# Patient Record
Sex: Female | Born: 1955 | Hispanic: No | Marital: Married | Smoking: Current every day smoker
Health system: Southern US, Community
[De-identification: ages and names within clinical notes are randomized; demographics above are authoritative.]

## PROBLEM LIST (undated history)

## (undated) ENCOUNTER — Emergency Department (HOSPITAL_COMMUNITY): Admission: EM | Payer: Self-pay | Source: Home / Self Care

## (undated) DIAGNOSIS — I10 Essential (primary) hypertension: Secondary | ICD-10-CM

## (undated) DIAGNOSIS — K219 Gastro-esophageal reflux disease without esophagitis: Secondary | ICD-10-CM

## (undated) DIAGNOSIS — E785 Hyperlipidemia, unspecified: Secondary | ICD-10-CM

## (undated) HISTORY — PX: ABDOMINAL HYSTERECTOMY: SHX81

## (undated) HISTORY — PX: TONSILLECTOMY: SUR1361

---

## 2008-09-20 ENCOUNTER — Emergency Department (HOSPITAL_COMMUNITY): Admission: EM | Admit: 2008-09-20 | Discharge: 2008-09-20 | Payer: Self-pay | Admitting: Family Medicine

## 2009-01-08 ENCOUNTER — Ambulatory Visit: Payer: Self-pay | Admitting: Family Medicine

## 2009-01-08 DIAGNOSIS — F329 Major depressive disorder, single episode, unspecified: Secondary | ICD-10-CM | POA: Insufficient documentation

## 2009-01-08 DIAGNOSIS — Z8639 Personal history of other endocrine, nutritional and metabolic disease: Secondary | ICD-10-CM

## 2009-01-08 DIAGNOSIS — J45909 Unspecified asthma, uncomplicated: Secondary | ICD-10-CM | POA: Insufficient documentation

## 2009-01-08 DIAGNOSIS — F3289 Other specified depressive episodes: Secondary | ICD-10-CM | POA: Insufficient documentation

## 2009-01-08 DIAGNOSIS — E785 Hyperlipidemia, unspecified: Secondary | ICD-10-CM | POA: Insufficient documentation

## 2009-01-08 DIAGNOSIS — Z862 Personal history of diseases of the blood and blood-forming organs and certain disorders involving the immune mechanism: Secondary | ICD-10-CM | POA: Insufficient documentation

## 2009-01-08 DIAGNOSIS — I10 Essential (primary) hypertension: Secondary | ICD-10-CM | POA: Insufficient documentation

## 2009-01-08 DIAGNOSIS — J309 Allergic rhinitis, unspecified: Secondary | ICD-10-CM | POA: Insufficient documentation

## 2009-01-09 ENCOUNTER — Ambulatory Visit: Payer: Self-pay | Admitting: *Deleted

## 2009-01-16 ENCOUNTER — Telehealth (INDEPENDENT_AMBULATORY_CARE_PROVIDER_SITE_OTHER): Payer: Self-pay | Admitting: Family Medicine

## 2009-01-16 LAB — CONVERTED CEMR LAB
ALT: 57 units/L — ABNORMAL HIGH (ref 0–35)
AST: 33 units/L (ref 0–37)
Albumin: 4.8 g/dL (ref 3.5–5.2)
Alkaline Phosphatase: 82 units/L (ref 39–117)
Basophils Relative: 0 % (ref 0–1)
CO2: 24 meq/L (ref 19–32)
Cholesterol: 280 mg/dL — ABNORMAL HIGH (ref 0–200)
HDL: 45 mg/dL (ref >39)
Hemoglobin: 14.4 g/dL (ref 12.0–15.0)
MCV: 93.9 fL (ref 78.0–100.0)
Monocytes Relative: 5 % (ref 3–12)
Neutrophils Relative %: 56 % (ref 43–77)
RBC: 4.88 M/uL (ref 3.87–5.11)
RDW: 12.7 % (ref 11.5–15.5)
Sodium: 143 meq/L (ref 135–145)
TSH: 4.122 microintl units/mL (ref 0.350–4.50)
Total CHOL/HDL Ratio: 6.2
Triglycerides: 216 mg/dL — ABNORMAL HIGH (ref <150)
WBC: 8.2 10*3/uL (ref 4.0–10.5)

## 2009-03-12 ENCOUNTER — Ambulatory Visit: Payer: Self-pay | Admitting: Family Medicine

## 2009-03-12 ENCOUNTER — Encounter (INDEPENDENT_AMBULATORY_CARE_PROVIDER_SITE_OTHER): Payer: Self-pay | Admitting: Family Medicine

## 2009-03-12 DIAGNOSIS — R7309 Other abnormal glucose: Secondary | ICD-10-CM | POA: Insufficient documentation

## 2009-03-12 LAB — CONVERTED CEMR LAB
ALT: 32 units/L (ref 0–35)
AST: 22 units/L (ref 0–37)
Alkaline Phosphatase: 75 units/L (ref 39–117)
Bilirubin Urine: NEGATIVE
Bilirubin, Direct: 0.1 mg/dL (ref 0.0–0.3)
Hep A Total Ab: NEGATIVE
Hep B Core Total Ab: NEGATIVE
Nitrite: NEGATIVE
Specific Gravity, Urine: <1.005
Total Bilirubin: 0.4 mg/dL (ref 0.3–1.2)
Urobilinogen, UA: 0.2
VLDL: 37 mg/dL (ref 0–40)
pH: 5

## 2009-03-18 ENCOUNTER — Encounter (INDEPENDENT_AMBULATORY_CARE_PROVIDER_SITE_OTHER): Payer: Self-pay | Admitting: Family Medicine

## 2009-09-04 ENCOUNTER — Ambulatory Visit: Payer: Self-pay | Admitting: Physician Assistant

## 2009-09-04 DIAGNOSIS — M25569 Pain in unspecified knee: Secondary | ICD-10-CM | POA: Insufficient documentation

## 2009-09-05 ENCOUNTER — Telehealth: Payer: Self-pay | Admitting: Physician Assistant

## 2009-09-09 ENCOUNTER — Telehealth: Payer: Self-pay | Admitting: Physician Assistant

## 2009-09-10 LAB — CONVERTED CEMR LAB
Albumin: 4.6 g/dL (ref 3.5–5.2)
Alkaline Phosphatase: 71 units/L (ref 39–117)
CO2: 24 meq/L (ref 19–32)
Chloride: 105 meq/L (ref 96–112)
Cholesterol: 170 mg/dL (ref 0–200)
Creatinine, Ser: 0.79 mg/dL (ref 0.40–1.20)
HDL: 42 mg/dL (ref >39)
Potassium: 4.2 meq/L (ref 3.5–5.3)
Total Bilirubin: 0.4 mg/dL (ref 0.3–1.2)
Total CHOL/HDL Ratio: 4
Triglycerides: 200 mg/dL — ABNORMAL HIGH (ref <150)
VLDL: 40 mg/dL (ref 0–40)

## 2009-10-16 ENCOUNTER — Telehealth: Payer: Self-pay | Admitting: Physician Assistant

## 2009-11-04 ENCOUNTER — Ambulatory Visit: Payer: Self-pay | Admitting: Internal Medicine

## 2009-11-04 ENCOUNTER — Encounter: Payer: Self-pay | Admitting: Physician Assistant

## 2009-12-05 ENCOUNTER — Ambulatory Visit: Payer: Self-pay | Admitting: Physician Assistant

## 2009-12-05 LAB — CONVERTED CEMR LAB
Calcium: 9.3 mg/dL (ref 8.4–10.5)
Creatinine, Ser: 0.79 mg/dL (ref 0.40–1.20)
Glucose, Bld: 85 mg/dL (ref 70–99)
HDL goal, serum: 40 mg/dL
LDL Goal: 160 mg/dL
Sodium: 143 meq/L (ref 135–145)

## 2009-12-07 ENCOUNTER — Encounter: Payer: Self-pay | Admitting: Physician Assistant

## 2010-01-20 ENCOUNTER — Ambulatory Visit: Payer: Self-pay | Admitting: Physician Assistant

## 2010-01-20 DIAGNOSIS — K219 Gastro-esophageal reflux disease without esophagitis: Secondary | ICD-10-CM | POA: Insufficient documentation

## 2010-01-20 DIAGNOSIS — R1011 Right upper quadrant pain: Secondary | ICD-10-CM | POA: Insufficient documentation

## 2010-01-22 ENCOUNTER — Ambulatory Visit (HOSPITAL_COMMUNITY): Admission: RE | Admit: 2010-01-22 | Discharge: 2010-01-22 | Payer: Self-pay | Admitting: Internal Medicine

## 2010-01-26 LAB — CONVERTED CEMR LAB
AST: 18 units/L (ref 0–37)
Albumin: 4.7 g/dL (ref 3.5–5.2)
Glucose, Bld: 109 mg/dL — ABNORMAL HIGH (ref 70–99)
Potassium: 4.4 meq/L (ref 3.5–5.3)
Total Protein: 7 g/dL (ref 6.0–8.3)

## 2010-02-12 ENCOUNTER — Telehealth: Payer: Self-pay | Admitting: Physician Assistant

## 2010-03-08 ENCOUNTER — Encounter: Payer: Self-pay | Admitting: Physician Assistant

## 2010-03-08 ENCOUNTER — Emergency Department (HOSPITAL_COMMUNITY): Admission: EM | Admit: 2010-03-08 | Discharge: 2010-03-08 | Payer: Self-pay | Admitting: Family Medicine

## 2010-03-12 ENCOUNTER — Ambulatory Visit: Payer: Self-pay | Admitting: Internal Medicine

## 2010-03-12 DIAGNOSIS — T7840XA Allergy, unspecified, initial encounter: Secondary | ICD-10-CM | POA: Insufficient documentation

## 2010-03-12 DIAGNOSIS — L738 Other specified follicular disorders: Secondary | ICD-10-CM | POA: Insufficient documentation

## 2010-03-12 DIAGNOSIS — J069 Acute upper respiratory infection, unspecified: Secondary | ICD-10-CM | POA: Insufficient documentation

## 2010-03-17 ENCOUNTER — Ambulatory Visit: Payer: Self-pay | Admitting: Internal Medicine

## 2010-05-29 ENCOUNTER — Encounter: Payer: Self-pay | Admitting: Physician Assistant

## 2010-06-10 ENCOUNTER — Telehealth: Payer: Self-pay | Admitting: Physician Assistant

## 2010-06-10 ENCOUNTER — Ambulatory Visit: Payer: Self-pay | Admitting: Physician Assistant

## 2010-06-10 DIAGNOSIS — E739 Lactose intolerance, unspecified: Secondary | ICD-10-CM | POA: Insufficient documentation

## 2010-06-10 DIAGNOSIS — K7689 Other specified diseases of liver: Secondary | ICD-10-CM | POA: Insufficient documentation

## 2010-06-10 DIAGNOSIS — G43909 Migraine, unspecified, not intractable, without status migrainosus: Secondary | ICD-10-CM | POA: Insufficient documentation

## 2010-06-10 DIAGNOSIS — H409 Unspecified glaucoma: Secondary | ICD-10-CM | POA: Insufficient documentation

## 2010-06-10 LAB — CONVERTED CEMR LAB
Bilirubin Urine: NEGATIVE
Glucose, Urine, Semiquant: NEGATIVE
Hgb A1c MFr Bld: 6 %
Ketones, urine, test strip: NEGATIVE
Urobilinogen, UA: 0.2
WBC Urine, dipstick: NEGATIVE

## 2010-06-11 LAB — CONVERTED CEMR LAB
ALT: 27 units/L (ref 0–35)
Albumin: 4.8 g/dL (ref 3.5–5.2)
Alkaline Phosphatase: 91 units/L (ref 39–117)
Basophils Absolute: 0 10*3/uL (ref 0.0–0.1)
CO2: 23 meq/L (ref 19–32)
Calcium: 9.3 mg/dL (ref 8.4–10.5)
Chloride: 103 meq/L (ref 96–112)
Cholesterol: 149 mg/dL (ref 0–200)
Eosinophils Absolute: 0.1 10*3/uL (ref 0.0–0.7)
Glucose, Bld: 87 mg/dL (ref 70–99)
HCT: 46.3 % — ABNORMAL HIGH (ref 36.0–46.0)
HDL: 47 mg/dL (ref >39)
Hemoglobin: 14.8 g/dL (ref 12.0–15.0)
LDL Cholesterol: 73 mg/dL (ref 0–99)
Lymphs Abs: 3.2 10*3/uL (ref 0.7–4.0)
MCHC: 32 g/dL (ref 30.0–36.0)
Platelets: 248 10*3/uL (ref 150–400)
Potassium: 3.9 meq/L (ref 3.5–5.3)
RBC: 4.89 M/uL (ref 3.87–5.11)
Sodium: 142 meq/L (ref 135–145)
Total CHOL/HDL Ratio: 3.2
WBC: 12.4 10*3/uL — ABNORMAL HIGH (ref 4.0–10.5)

## 2010-06-12 ENCOUNTER — Ambulatory Visit (HOSPITAL_COMMUNITY): Admission: RE | Admit: 2010-06-12 | Discharge: 2010-06-12 | Payer: Self-pay | Admitting: Internal Medicine

## 2010-06-19 ENCOUNTER — Encounter: Admission: RE | Admit: 2010-06-19 | Discharge: 2010-06-19 | Payer: Self-pay | Admitting: Internal Medicine

## 2010-06-26 ENCOUNTER — Encounter: Payer: Self-pay | Admitting: Physician Assistant

## 2010-06-29 ENCOUNTER — Encounter: Payer: Self-pay | Admitting: Physician Assistant

## 2010-10-23 ENCOUNTER — Ambulatory Visit: Payer: Self-pay | Admitting: Nurse Practitioner

## 2010-12-07 ENCOUNTER — Encounter: Payer: Self-pay | Admitting: Family Medicine

## 2010-12-15 NOTE — Assessment & Plan Note (Signed)
Summary: 3 MONTHS FOR ASTHMA/HTN//KT   Vital Signs:  Patient profile:   55 year old female Height:      60 inches Weight:      177 pounds BMI:     34.69 O2 Sat:      95 % on Room air Temp:     98.3 degrees F oral Pulse rate:   76 / minute Pulse rhythm:   regular Resp:     18 per minute BP sitting:   125 / 80  (left arm) Cuff size:   regular  Vitals Entered By: Armenia Shannon (December 05, 2009 8:26 AM)  O2 Flow:  Room air CC: three month....., Hypertension Management, Lipid Management Is Patient Diabetic? No Pain Assessment Patient in pain? no       Does patient need assistance? Functional Status Self care Ambulation Normal Comments pf  1.   220    2.   220     3.   220   CC:  three month....., Hypertension Management, and Lipid Management.  History of Present Illness: 55 year old female who presents for followup.  Asthma: She seems to be having more symptoms of chest tightness and shortness of breath.  See the asthma assessment below.  It is difficult to fully assess her symptoms with the translation.  She is here with her daughter today who helps translate.  It is apparent that the patient has had fairly stable symptoms for a long time.  However, later in the interview it seems that her symptoms may have worsened.  She did have a URI recently.  She is still having some minimal cough from this.  She does have a lot of sinus drainage.  She denies fevers.  She denies hemoptysis.  She denies exertional chest pain.  She denies arm or jaw discomfort.  She denies syncope.  She is not sure how to use her inhaler.  She uses her Ventolin once or twice a week.  She feels as though she could use it several times a week.  She also notes a problem with a bitter taste with everything.  This is been ongoing for about a year.  She also has occasional epistaxis.  Dyslipidemia: Lopressor high last time.  I asked her to start artificial.  She's taking this without problem.  Depression: The  patient has not taken Xanax in several weeks.  She feels as though she should continue on it.  There were at least 2-3 days a week where she feels like she needs it for anxiety.  I question whether or not the anxiety may be playing a role in some of her increased shortness of breath and asthma-like symptoms.  Asthma History    Asthma Control Assessment:    Age range: 12+ years    Symptoms: >2 days/week    Nighttime Awakenings: 0-2/month    Interferes w/ normal activity: no limitations    SABA use (not for EIB): >2 days/week    Asthma Control Assessment: Not Well Controlled  Hypertension History:      She complains of chest pain and palpitations, but denies dyspnea with exertion and syncope.        Positive major cardiovascular risk factors include hyperlipidemia and hypertension.  Negative major cardiovascular risk factors include female age less than 72 years old, no history of diabetes, negative family history for ischemic heart disease, and non-tobacco-user status.        Further assessment for target organ damage reveals no history of ASHD, stroke/TIA,  or peripheral vascular disease.    Lipid Management History:      Positive NCEP/ATP III risk factors include hypertension.  Negative NCEP/ATP III risk factors include female age less than 68 years old, non-diabetic, no family history for ischemic heart disease, non-tobacco-user status, no ASHD (atherosclerotic heart disease), no prior stroke/TIA, no peripheral vascular disease, and no history of aortic aneurysm.        Current Medications (verified): 1)  Advair Diskus 500-50 Mcg/dose Misc (Fluticasone-Salmeterol) .Marland Kitchen.. 1 Puff 2 Times Daily 2)  Cozaar 50 Mg Tabs (Losartan Potassium) .... Take 1 Tablet By Mouth Every Morning 3)  Remeron 45 Mg Tabs (Mirtazapine) .... Take 1 Tablet By Mouth Once A Day 4)  Aldactone 25 Mg Tabs (Spironolactone) .... Take 1 Tablet By Mouth Once A Day 5)  Ventolin Hfa 108 (90 Base) Mcg/act Aers (Albuterol  Sulfate) .... Use 2 Puffs Every 4-6 Hours As Needed For Cough/shortness of Breath 6)  Alprazolam 0.5 Mg Tabs (Alprazolam) .... Take 1/2-1  Tablet By Mouth Once Daily As Needed 7)  Allegra 180 Mg Tabs (Fexofenadine Hcl) .... Take 1 Tablet By Mouth Once A Day As Needed For Allergy Symptoms 8)  Lexapro 20 Mg Tabs (Escitalopram Oxalate) .Marland Kitchen.. 1 By Mouth Daily 9)  Lipitor 20 Mg Tabs (Atorvastatin Calcium) .... Take 1 Tab By Mouth Daily **needs Office Visit/lab** 10)  Flonase 50 Mcg/act Susp (Fluticasone Propionate) .... Use 2 Sprays in Each Nostril Each Day 11)  Naprosyn 500 Mg Tabs (Naproxen) .... Take 1 Tablet By Mouth Every 12 Hours As Needed Pain. Take With Food.  Allergies (verified): No Known Drug Allergies  Past History:  Past Medical History: Allergic rhinitis Asthma - Dx in [place] as an adult; did not smoke before dx. Hypertension Depression...chronic x 20 years/has had hallucinations in past;by family description some catatonic sxs in past. 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 Just arrived in [place].Lives with adult daughter. Married Ex-smoker (6 pk years; quit 2006) Alcohol use-no Drug use-no  Physical Exam  General:  alert, well-developed, and well-nourished.   Head:  normocephalic and atraumatic.   Eyes:  pupils equal, pupils round, pupils reactive to light, and no injection.   Ears:  R ear normal and L ear normal.   Nose:  no external deformity, no nasal discharge, and mucosal erythema.   Mouth:  pharynx pink and moist, no erythema, and no exudates.   Neck:  supple and no cervical lymphadenopathy.   Lungs:  normal breath sounds, no crackles, and no wheezes.   Heart:  normal rate and regular rhythm.   Neurologic:  alert & oriented X3 and cranial nerves II-XII intact.     Impression & Recommendations:  Problem # 1:  ASTHMA (ICD-493.90) difficult to assess fully with  translation I believe she is describing poorly controlled asthma d/w Dr. Delrae Alfred will add singulair first check ECG with h/o chest discomfort, but I think she is describing chest tightness from asthma f/u in 6 weeks consider adding PPI vs. H2RA at that time  Her updated medication list for this problem includes:    Advair Diskus 500-50 Mcg/dose Misc (Fluticasone-salmeterol) .Marland Kitchen... 1 puff 2 times daily    Ventolin Hfa 108 (90 Base) Mcg/act Aers (Albuterol sulfate) ..... Use 2 puffs every 4-6 hours as needed for cough/shortness of breath    Singulair 10 Mg Tabs (Montelukast sodium) .Marland Kitchen... Take 1 tablet by mouth once a day for allergies and asthma  Problem #  2:  ALLERGIC RHINITIS (ICD-477.9) nasal membranes red and she is c/o some epistaxis as well as bitter taste have asked her to take a "holiday" from flonase she can use 3 weeks and go off x 2 weeks she should also use saline nose spray as needed throughout the day  Her updated medication list for this problem includes:    Allegra 180 Mg Tabs (Fexofenadine hcl) .Marland Kitchen... Take 1 tablet by mouth once a day as needed for allergy symptoms    Flonase 50 Mcg/act Susp (Fluticasone propionate) ..... Use 2 sprays in each nostril each day  Problem # 3:  HYPERLIPIDEMIA (ICD-272.4) added fish oil last visit  Her updated medication list for this problem includes:    Lipitor 20 Mg Tabs (Atorvastatin calcium) .Marland Kitchen... Take 1 tab by mouth daily **needs office visit/lab**  Problem # 4:  HYPERTENSION (ICD-401.9)  controlled  Her updated medication list for this problem includes:    Cozaar 50 Mg Tabs (Losartan potassium) .Marland Kitchen... Take 1 tablet by mouth every morning    Aldactone 25 Mg Tabs (Spironolactone) .Marland Kitchen... Take 1 tablet by mouth once a day  Orders: T-Basic Metabolic Panel [id number]) EKG w/ Interpretation (93000)  Problem # 5:  DEPRESSION (ICD-311) anxiety may be playing a role in her symptoms as well will refill limited amount of  alprazolam have advised her to use only as needed  Her updated medication list for this problem includes:    Remeron 45 Mg Tabs (Mirtazapine) .Marland Kitchen... Take 1 tablet by mouth once a day    Alprazolam 0.5 Mg Tabs (Alprazolam) .Marland Kitchen... Take 1/2-1  tablet by mouth once daily as needed    Lexapro 20 Mg Tabs (Escitalopram oxalate) .Marland Kitchen... 1 by mouth daily  Complete Medication List: 1)  Advair Diskus 500-50 Mcg/dose Misc (Fluticasone-salmeterol) .Marland Kitchen.. 1 puff 2 times daily 2)  Cozaar 50 Mg Tabs (Losartan potassium) .... Take 1 tablet by mouth every morning 3)  Remeron 45 Mg Tabs (Mirtazapine) .... Take 1 tablet by mouth once a day 4)  Aldactone 25 Mg Tabs (Spironolactone) .... Take 1 tablet by mouth once a day 5)  Ventolin Hfa 108 (90 Base) Mcg/act Aers (Albuterol sulfate) .... Use 2 puffs every 4-6 hours as needed for cough/shortness of breath 6)  Alprazolam 0.5 Mg Tabs (Alprazolam) .... Take 1/2-1  tablet by mouth once daily as needed 7)  Allegra 180 Mg Tabs (Fexofenadine hcl) .... Take 1 tablet by mouth once a day as needed for allergy symptoms 8)  Lexapro 20 Mg Tabs (Escitalopram oxalate) .Marland Kitchen.. 1 by mouth daily 9)  Lipitor 20 Mg Tabs (Atorvastatin calcium) .... Take 1 tab by mouth daily **needs office visit/lab** 10)  Flonase 50 Mcg/act Susp (Fluticasone propionate) .... Use 2 sprays in each nostril each day 11)  Naprosyn 500 Mg Tabs (Naproxen) .... Take 1 tablet by mouth every 12 hours as needed pain. take with food. 12)  Singulair 10 Mg Tabs (Montelukast sodium) .... Take 1 tablet by mouth once a day for allergies and asthma  Hypertension Assessment/Plan:      The patient's hypertensive risk group is category B: At least one risk factor (excluding diabetes) with no target organ damage.  Her calculated 10 year risk of coronary heart disease is 7 %.  Today's blood pressure is 125/80.  Her blood pressure goal is < 140/90.  Lipid Assessment/Plan:      Based on NCEP/ATP III, the patient's risk factor  category is "0-1 risk factors".  The patient's lipid goals are as  follows: Total cholesterol goal is 200; LDL cholesterol goal is 160; HDL cholesterol goal is 40; Triglyceride goal is 150.    Patient Instructions: 1)  I have sent a prescription to [facility] for Singulair.  Take one tablet every day for allergies and asthma. 2)  Use your ventolin inhaler 1-2 puffs every 4-6 hours as needed for coughing, wheezing, shortness of breath. 3)  Take 2 weeks off from flonase.  You can use for 3 weeks, then go off for 2 weeks, then back on for 3 weeks, and so on. 4)  Use saline nose spray (get it over the counter) several times throughout the day as needed (but not at the same time as flonase). 5)  Schedule a follow up with Josilynn Losh in 6 weeks for asthma. Prescriptions: SINGULAIR 10 MG TABS (MONTELUKAST SODIUM) Take 1 tablet by mouth once a day for allergies and asthma  #30 x 5   Entered and Authorized by:   Tereso Newcomer PA-C   Signed by:   Tereso Newcomer PA-C on 12/05/2009   Method used:   Faxed to ...       [facility] (retail)       [address] [place], [place]  [address]       Ph: [phone]       Fax: [phone]   RxID:   [id number] ALPRAZOLAM 0.5 MG TABS (ALPRAZOLAM) Take 1/2-1  tablet by mouth once daily as needed  #30 x 0   Entered and Authorized by:   Tereso Newcomer PA-C   Signed by:   Tereso Newcomer PA-C on 12/05/2009   Method used:   Print then Give to Patient   RxID:   [id number]    EKG  Procedure date:  12/05/2009  Findings:      NSR HR 64 Normal axis no acute changes

## 2010-12-15 NOTE — Letter (Signed)
Summary: DENTAL REFERRAL  DENTAL REFERRAL   Imported By: Arta Bruce 05/29/2010 09:10:22  _____________________________________________________________________  External Attachment:    Type:   Image     Comment:   External Document

## 2010-12-15 NOTE — Letter (Signed)
Summary: TRIAGE CALL  TRIAGE CALL   Imported By: Arta Bruce 03/16/2010 10:37:21  _____________________________________________________________________  External Attachment:    Type:   Image     Comment:   External Document

## 2010-12-15 NOTE — Assessment & Plan Note (Signed)
Summary: 6 week fu on asthma///kt   Vital Signs:  Patient profile:   55 year old female Height:      60 inches Weight:      177 pounds BMI:     34.69 Temp:     98.1 degrees F oral Pulse rate:   80 / minute Pulse rhythm:   regular Resp:     18 per minute BP sitting:   110 / 80  (left arm) Cuff size:   regular  Vitals Entered By: [place] Shannon (January 20, 2010 8:35 AM) CC: six week f/u... pt says she has starting to feel right ear pressure sometimes.... pt says she feels pressure on her right side of her stomach..., Lipid Management Is Patient Diabetic? No Pain Assessment Patient in pain? no       Does patient need assistance? Functional Status Self care Ambulation Normal   CC:  six week f/u... pt says she has starting to feel right ear pressure sometimes.... pt says she feels pressure on her right side of her stomach... and Lipid Management.  History of Present Illness: Here for f/u.  Asthma:  Added singulair last time.  Symptoms seem much better.  Says she is only using ventolin maybe every other week.  No chest tightness.    Allergies:  She is using saline for 1-2 weeks and then flonase for one week.  She denies having as much nasal dryness.  She denies any further epistaxis.  GERD:  Using TUMS every day.  + indigestion.  No excessive caffeine.  NO smoking.  No dysphagia.  No melena or hematochezia.  No belching.  Ear:  Notes pressure in right ear.  Not internal.    RUQ pressure:  Feels pressure 2-3 times a week in RUQ.  She has noted this symptom for years.  Did not have for 2 years.  Started having it recently.  Eating does not bring it on.  No nausea.  No vomiting.  No weight loss.  No night sweats.  No diarrhea.  Asthma History    Asthma Control Assessment:    Age range: 12+ years    Symptoms: 0-2 days/week    Nighttime Awakenings: 0-2/month    Interferes w/ normal activity: no limitations    SABA use (not for EIB): 0-2 days/week    Asthma Control Assessment:  Well Controlled  Lipid Management History:      Positive NCEP/ATP III risk factors include hypertension.  Negative NCEP/ATP III risk factors include female age less than 28 years old, non-diabetic, no family history for ischemic heart disease, non-tobacco-user status, no ASHD (atherosclerotic heart disease), no prior stroke/TIA, no peripheral vascular disease, and no history of aortic aneurysm.     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 1 Puff 2 Times Daily 2)  Cozaar 50 Mg Tabs (Losartan Potassium) .... Take 1 Tablet By Mouth Every Morning 3)  Remeron  45 Mg Tabs (Mirtazapine) .... Take 1 Tablet By Mouth Once A Day 4)  Aldactone 25 Mg Tabs (Spironolactone) .... Take 1 Tablet By Mouth Once A Day 5)  Ventolin Hfa 108 (90 Base) Mcg/act Aers (Albuterol Sulfate) .... Use 2 Puffs Every 4-6 Hours As Needed For Cough/shortness of Breath 6)  Alprazolam 0.5 Mg Tabs (Alprazolam) .... Take 1/2-1  Tablet By Mouth Once Daily As Needed 7)  Allegra 180 Mg Tabs (Fexofenadine Hcl) .... Take 1 Tablet By Mouth Once A Day As Needed For Allergy Symptoms 8)  Lexapro 20 Mg Tabs (Escitalopram Oxalate) .Marland Kitchen.. 1 By Mouth Daily 9)  Lipitor 20 Mg Tabs (Atorvastatin Calcium) .... Take 1 Tab By Mouth Daily **needs Office Visit/lab** 10)  Flonase 50 Mcg/act Susp (Fluticasone Propionate) .... Use 2 Sprays in Each  Nostril Each Day 11)  Naprosyn 500 Mg Tabs (Naproxen) .... Take 1 Tablet By Mouth Every 12 Hours As Needed Pain. Take With Food. 12)  Singulair 10 Mg Tabs (Montelukast Sodium) .... Take 1 Tablet By Mouth Once A Day For Allergies and Asthma  Allergies (verified): No Known Drug Allergies  Past History:  Past Medical History: Last updated: 12/05/2009 Allergic rhinitis Asthma - Dx in [place] as an adult; did not smoke before dx. Hypertension Depression...chronic x 20 years/has had hallucinations in past;by family description some catatonic sxs in past. h/o endometriosis 11/1988 Hyperlipidemia  Past Surgical History: Last updated: 03/12/2009 s/p Ovarian surgery 11/15/1988. Endometriosis 11/1988  Physical Exam  General:  alert, well-developed, and well-nourished.   Head:  normocephalic and atraumatic.   Eyes:  pupils equal, pupils round, and pupils reactive to light.   Ears:  R ear normal and L ear normal.   Nose:  no external deformity.   Mouth:  pharynx pink and moist.   Lungs:  normal breath sounds, no crackles, and no wheezes.   Heart:  normal rate and regular rhythm.   Abdomen:  soft, non-tender, normal bowel sounds, no masses, and no hepatomegaly.   Neurologic:  alert & oriented X3 and cranial nerves II-XII intact.   Skin:  turgor normal.   Psych:  normally interactive.     Impression & Recommendations:  Problem # 1:  GERD (ICD-530.81)  add Pepcid take x 6 weeks, then as needed may help asthma symptoms further  Her updated medication list for this problem includes:    Pepcid 20 Mg Tabs (Famotidine) .Marland Kitchen... Take 1 tablet by mouth two times a day for 6 weeks, then two times a day as needed for indigestion  Problem # 2:  ASTHMA (ICD-493.90) much better controlled  cont current meds  Her updated medication list for this problem includes:    Advair Diskus 500-50 Mcg/dose Misc (Fluticasone-salmeterol) .Marland Kitchen... 1 puff 2 times daily    Ventolin Hfa 108 (90 Base) Mcg/act Aers  (Albuterol sulfate) ..... Use 2 puffs every 4-6 hours as needed for cough/shortness of breath    Singulair 10 Mg Tabs (Montelukast sodium) .Marland Kitchen... Take 1 tablet by mouth once a day for allergies and asthma  Problem # 3:  ALLERGIC RHINITIS (ICD-477.9) epistaxis better with alternating flonase and saline exam on right ear benign reassurance and monitor  Her updated medication list for this problem includes:    Allegra 180 Mg Tabs (Fexofenadine hcl) .Marland Kitchen... Take 1 tablet by mouth once a day as needed for allergy symptoms    Flonase 50 Mcg/act Susp (Fluticasone propionate) ..... Use 2 sprays in each nostril each day  Problem # 4:  RUQ PAIN (  ICD-789.01)  describes having abd u/s in [place] years ago with a "knot" noted on her liver surgery; biopsy never recommended no h/o cholecystectomy has had mild elevation in transaminases in past hepatitis serologies negative in 2010 check cmet check abd ultrasound  Orders: T-Comprehensive Metabolic Panel ([id number]) Ultrasound (Ultrasound)  Problem # 5:  HYPERTENSION (ICD-401.9) controlled  Her updated medication list for this problem includes:    Cozaar 50 Mg Tabs (Losartan potassium) .Marland Kitchen... Take 1 tablet by mouth every morning    Aldactone 25 Mg Tabs (Spironolactone) .Marland Kitchen... Take 1 tablet by mouth once a day  Problem # 6:  Preventive Health Care (ICD-V70.0) schedule CPP  Problem # 7:  HYPERLIPIDEMIA (ICD-272.4) check lipids at next visit  Her updated medication list for this problem includes:    Lipitor 20 Mg Tabs (Atorvastatin calcium) .Marland Kitchen... Take 1 tab by mouth daily **needs office visit/lab**  Complete Medication List: 1)  Advair Diskus 500-50 Mcg/dose Misc (Fluticasone-salmeterol) .Marland Kitchen.. 1 puff 2 times daily 2)  Cozaar 50 Mg Tabs (Losartan potassium) .... Take 1 tablet by mouth every morning 3)  Remeron 45 Mg Tabs (Mirtazapine) .... Take 1 tablet by mouth once a day 4)  Aldactone 25 Mg Tabs (Spironolactone) .... Take 1 tablet by mouth  once a day 5)  Ventolin Hfa 108 (90 Base) Mcg/act Aers (Albuterol sulfate) .... Use 2 puffs every 4-6 hours as needed for cough/shortness of breath 6)  Alprazolam 0.5 Mg Tabs (Alprazolam) .... Take 1/2-1  tablet by mouth once daily as needed 7)  Allegra 180 Mg Tabs (Fexofenadine hcl) .... Take 1 tablet by mouth once a day as needed for allergy symptoms 8)  Lexapro 20 Mg Tabs (Escitalopram oxalate) .Marland Kitchen.. 1 by mouth daily 9)  Lipitor 20 Mg Tabs (Atorvastatin calcium) .... Take 1 tab by mouth daily **needs office visit/lab** 10)  Flonase 50 Mcg/act Susp (Fluticasone propionate) .... Use 2 sprays in each nostril each day 11)  Naprosyn 500 Mg Tabs (Naproxen) .... Take 1 tablet by mouth every 12 hours as needed pain. take with food. 12)  Singulair 10 Mg Tabs (Montelukast sodium) .... Take 1 tablet by mouth once a day for allergies and asthma 13)  Pepcid 20 Mg Tabs (Famotidine) .... Take 1 tablet by mouth two times a day for 6 weeks, then two times a day as needed for indigestion  Lipid Assessment/Plan:      Based on NCEP/ATP III, the patient's risk factor category is "0-1 risk factors".  The patient's lipid goals are as follows: Total cholesterol goal is 200; LDL cholesterol goal is 160; HDL cholesterol goal is 40; Triglyceride goal is 150.     Patient Instructions: 1)  Please schedule a follow-up appointment in 2 months with Aurea Aronov for CPP.  Come fasting . . . do not eat or drink anything after midnight the night before (except water). 2)  Prescription for pepcid was sent to the [facility] on [facility]. 3)    Prescriptions: PEPCID 20 MG TABS (FAMOTIDINE) Take 1 tablet by mouth two times a day for 6 weeks, then two times a day as needed for indigestion  #60 x 3   Entered and Authorized by:   Tereso Newcomer PA-C   Signed by:   Tereso Newcomer PA-C on 01/20/2010   Method used:   Faxed to ...       [facility] (retail)       [address]  [place], [place]  [address]       Ph: [phone]       Fax: [phone]   RxID:   [id number]

## 2010-12-15 NOTE — Assessment & Plan Note (Signed)
Summary: 2236 PT/REACTION TO MEDS//KT   Vital Signs:  Patient profile:   55 year old female Weight:      179 pounds Temp:     97.9 degrees F Pulse rate:   76 / minute Pulse rhythm:   regular Resp:     14 per minute BP sitting:   120 / 77  (left arm) Cuff size:   regular  Vitals Entered By: Chauncy Passy, SMA CC: Pt. is here for a f/u from the hosp. Pt. went to the hosp. for a break out of rosacea on face, neck and arms. This began last Thursday and went to the [facility]. on Sunday. Pt. is also complaiining of abd. pain and itching on the face.  Is Patient Diabetic? No Pain Assessment Patient in pain? no       Does patient need assistance? Functional Status Self care Ambulation Normal   CC:  Pt. is here for a f/u from the hosp. Pt. went to the hosp. for a break out of rosacea on face and neck and arms. This began last Thursday and went to the [facility]. on Sunday. Pt. is also complaiining of abd. pain and itching on the face. Marland Kitchen  History of Present Illness: 1.  Rash started on face, then ears one week ago.  Eventually on wrists.  Pt. with hx of Rosacea, but has not had outbreak in 2-3 years.  Pt. states through translation, (originally from [place]) rash started out as a few purple dots.  Very pruritic--started day after initiation of meds from ED.  No pustular lesions per patient, but described as papular pustular lesions by ED..  Pt. not clear as to how much this resembles the lesions she had with Rosacea.  Was seen in ED 03/08/10 and started on Doxycycline and topical Metronidazole.-on antibiotics for 4 days.    Pt. with no new exposures--pt. apparently very careful regarding this.  Last use of both oral and topical antibiotics was last evening.  Also with congestion and cough in ED and started on cough medication for that (Codeine and Tylenol elixir)  Lungs were clear in ED.  Last use of cough medication was last night.  Takes Allegra in the morning.  Current Medications (verified): 1)   Advair Diskus 500-50 Mcg/dose Misc (Fluticasone-Salmeterol) .Marland Kitchen.. 1 Puff 2 Times Daily 2)  Cozaar 50 Mg Tabs (Losartan Potassium) .... Take 1 Tablet By Mouth Every Morning 3)  Remeron 45 Mg Tabs (Mirtazapine) .... Take 1 Tablet By Mouth Once A Day 4)  Aldactone 25 Mg Tabs (Spironolactone) .... Take 1 Tablet By Mouth Once A Day 5)  Ventolin Hfa 108 (90 Base) Mcg/act Aers (Albuterol Sulfate) .... Use 2 Puffs Every 4-6 Hours As Needed For Cough/shortness of Breath 6)  Alprazolam 0.5 Mg Tabs (Alprazolam) .... Take 1/2-1  Tablet By Mouth Once Daily As Needed 7)  Allegra 180 Mg Tabs (Fexofenadine Hcl) .... Take 1 Tablet By Mouth Once A Day As Needed For Allergy Symptoms 8)  Lexapro 20 Mg Tabs (Escitalopram Oxalate) .Marland Kitchen.. 1 By Mouth Daily 9)  Lipitor 20 Mg Tabs (Atorvastatin Calcium) .... Take 1 Tab By Mouth Daily **needs Office Visit/lab** 10)  Flonase 50 Mcg/act Susp (Fluticasone Propionate) .... Use 2 Sprays in Each Nostril Each Day 11)  Naprosyn 500 Mg Tabs (Naproxen) .... Take 1 Tablet By Mouth Every 12 Hours As Needed Pain. Take With Food. 12)  Singulair 10 Mg Tabs (Montelukast Sodium) .... Take 1 Tablet By Mouth Once A Day For Allergies and  Asthma 13)  Pepcid 20 Mg Tabs (Famotidine) .... Take 1 Tablet By Mouth Two Times A Day For 6 Weeks, Then Two Times A Day As Needed For Indigestion  Allergies (verified): 1)  ! Metrocream (Metronidazole)  Physical Exam  General:  NAD--obvious rash on face and right wrist. Eyes:  MIld edema of upper and lower lids with slight injection of conjunctivae. Ears:  External ear exam shows no significant lesions or deformities.  Otoscopic examination reveals clear canals, tympanic membranes are intact bilaterally without bulging, retraction, inflammation or discharge. Hearing is grossly normal bilaterally. Nose:  External nasal examination shows no deformity or inflammation. Nasal mucosa are pink and moist without lesions or exudates. Mouth:  Oral mucosa and  oropharynx without lesions or exudates.  Teeth in good repair. Neck:  No deformities, masses, or tenderness noted. Lungs:  Slight rhonchi initially--resolves with cough.  Good air movement and no wheezing. Heart:  Normal rate and regular rhythm. S1 and S2 normal without gallop, murmur, click, rub or other extra sounds. Skin:  1-2 mm pustules scattered on chest--pt. states these just started--has not applied Metronidazole to this area.   Face with edemoutous, raised, erythematous small macules with  intermingles pustules on forehead, T zone area, cheeks, preauricular areas, posterior neck and volar wrist--very confluent with these lesions--appear much more erythematous and edematous than newer, topically untreated areas of chest.    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 Continue with Doxycycline--appears to be just affected in areas where Metronidazole applied--to stop the latter.  Problem # 2:  ALLERGIC REACTION, ACUTE (ICD-995.3) Stop Metronidazole Start short course of Prednisone. Continue a.m. Allegra Add P.m. Benadryl  Problem # 3:  UPPER RESPIRATORY INFECTION (ICD-465.9) Vs. Allergies--supportive care Prednisone may help. Suggested holding cough suppressant/codeine as needs to mobilize secretions. Her updated medication list for this problem includes:    Allegra 180 Mg Tabs (Fexofenadine hcl) .Marland Kitchen... Take 1 tablet by mouth once a day as needed for allergy symptoms    Naprosyn 500 Mg Tabs (Naproxen) .Marland Kitchen... Take 1 tablet by mouth every 12 hours as needed pain. take with food.  Complete Medication List: 1)  Advair Diskus 500-50 Mcg/dose Misc (Fluticasone-salmeterol) .Marland Kitchen.. 1 puff 2 times daily 2)  Cozaar 50 Mg Tabs (Losartan potassium) .... Take 1 tablet by mouth every morning 3)  Remeron 45 Mg Tabs (Mirtazapine) .... Take 1 tablet by mouth once a day 4)  Aldactone 25 Mg Tabs (Spironolactone) .... Take 1 tablet by mouth once a day 5)   Ventolin Hfa 108 (90 Base) Mcg/act Aers (Albuterol sulfate) .... Use 2 puffs every 4-6 hours as needed for cough/shortness of breath 6)  Alprazolam 0.5 Mg Tabs (Alprazolam) .... Take 1/2-1  tablet by mouth once daily as needed 7)  Allegra 180 Mg Tabs (Fexofenadine hcl) .... Take 1 tablet by mouth once a day as needed for allergy symptoms 8)  Lexapro 20 Mg Tabs (Escitalopram oxalate) .Marland Kitchen.. 1 by mouth daily 9)  Lipitor 20 Mg Tabs (Atorvastatin calcium) .... Take 1 tab by mouth daily **needs office visit/lab** 10)  Flonase 50 Mcg/act Susp (Fluticasone propionate) .... Use 2 sprays in each nostril each day 11)  Naprosyn 500 Mg Tabs (Naproxen) .... Take 1 tablet by mouth every 12 hours as needed pain. take with food. 12)  Singulair 10 Mg Tabs (Montelukast sodium) .... Take 1 tablet by mouth once a day for allergies and asthma 13)  Pepcid 20 Mg Tabs (Famotidine) .... Take 1 tablet  by mouth two times a day for 6 weeks, then two times a day as needed for indigestion 14)  Prednisone 20 Mg Tabs (Prednisone) .Marland Kitchen.. 1 tab by mouth daily for 5 days  Patient Instructions: 1)  Stop Metronidazole cream 2)  Continue Doxycycline--unless rash and itching continue to get worse. 3)  Would get Robitussin for cough instead of cough syrup from ED 4)  Cool compresses for itching and rash. 5)  Start prednisone today 6)  Benadryl (Diphenhydramine )  25 mg at bedtime for itching and swelling. 7)  Follow up Monday with Tereso Newcomer or with Dr. Delrae Alfred on Tuesday. Prescriptions: PREDNISONE 20 MG TABS (PREDNISONE) 1 tab by mouth daily for 5 days  #5 x 0   Entered and Authorized by:   Julieanne Manson MD   Signed by:   Julieanne Manson MD on 03/12/2010   Method used:   Electronically to        [facility] [address] (retail)       [phone]       [place], [place]  [address]       Ph: [phone]       Fax: [phone]   RxID:   [id number]

## 2010-12-15 NOTE — Assessment & Plan Note (Signed)
Summary: FU FROM LAST VISIT//KT   Vital Signs:  Patient profile:   55 year old female Height:      60 inches Weight:      181 pounds BMI:     35.48 Temp:     98.3 degrees F oral Pulse rate:   73 / minute Pulse rhythm:   regular Resp:     18 per minute BP sitting:   145 / 77  (left arm) Cuff size:   regular  Vitals Entered By: [place] Shannon (June 10, 2010 11:44 AM) CC: f/u from last appt...Marland KitchenMarland Kitchen pt says she is doing a lot better...., Hypertension Management, Abdominal Pain Is Patient Diabetic? Yes Pain Assessment Patient in pain? no      CBG Result 108  Does patient need assistance? Functional Status Self care Ambulation Normal Comments pf   1.   220      2.    230          3.   230   Primary Care Provider:  Tereso Newcomer PA-C  CC:  f/u from last appt...Marland KitchenMarland Kitchen pt says she is doing a lot better...., Hypertension Management, and Abdominal Pain.  History of Present Illness: 55 year old female returns for followup.  Since I last saw her, she saw Dr. Delrae Alfred for an allergic reaction on her face.  It sounds like this was secondary to metronidazole gel for rosacea.  This completely cleared.  She continues on Advair twice a day and as needed Ventolin.  Her asthma symptoms are stable.  She admits to compliance with her blood pressure medications.  She has seen ophthalmology.  She does have glaucoma.  She is now on Travatan eyedrops.  She is hesitant to go back to see the ophthalmologist secondary to cost.  She has started taking Pepcid.  She only takes it once a day now.  Her symptoms have completely resolved with the Pepcid.  She had an elevated sugar on recent blood work.  Her A1c today 6.  She does have fatty infiltration of her liver and abdominal ultrasound.  She's currently being treated for hyperlipidemia.  She was due for a CPP.  She has not yet scheduled that.  She notes occasional headaches since she was 16.  This is  right-sided.  It is throbbing.  She denies scotoma.  She denies  associated nausea or photophobia.  Asthma History    Asthma Control Assessment:    Age range: 12+ years    Symptoms: 0-2 days/week    Nighttime Awakenings: 0-2/month    Interferes w/ normal activity: no limitations    SABA use (not for EIB): 0-2 days/week    Asthma Control Assessment: Well Controlled  Dyspepsia History:      She has no alarm features of dyspepsia including no history of melena, hematochezia, dysphagia, persistent vomiting, or involuntary weight loss > 5%.  There is a prior history of GERD.  An H-2 blocker medication is currently being taken.    Hypertension History:      She complains of headache, but denies chest pain, dyspnea with exertion, and syncope.  Further comments include: rare headaches; has a h/o migraines .        Positive major cardiovascular risk factors include hyperlipidemia and hypertension.  Negative major cardiovascular risk factors include female age less than 58 years old, no history of diabetes, negative family history for ischemic heart disease, and non-tobacco-user status.        Further assessment for target organ damage reveals no  history of ASHD, stroke/TIA, or peripheral vascular disease.     Current Medications (verified): 1)  Advair Diskus 500-50 Mcg/dose Misc (Fluticasone-Salmeterol) .Marland Kitchen.. 1 Puff 2 Times Daily 2)  Cozaar 50 Mg Tabs (Losartan Potassium) .... Take 1 Tablet By Mouth Every Morning 3)  Remeron 45 Mg Tabs (Mirtazapine) .... Take 1 Tablet By Mouth Once A Day 4)  Aldactone 25 Mg Tabs (Spironolactone) .... Take 1 Tablet By Mouth Once A Day 5)  Ventolin Hfa 108 (90 Base) Mcg/act Aers (Albuterol Sulfate) .... Use 2 Puffs Every 4-6 Hours As Needed For Cough/shortness of Breath 6)  Alprazolam 0.5 Mg Tabs (Alprazolam) .... Take 1/2-1  Tablet By Mouth Once Daily As Needed 7)  Allegra 180 Mg Tabs (Fexofenadine Hcl) .... Take 1 Tablet By Mouth Once A Day As Needed For Allergy Symptoms 8)  Lexapro 20 Mg Tabs (Escitalopram Oxalate) .Marland Kitchen.. 1 By  Mouth Daily 9)  Lipitor 20 Mg Tabs (Atorvastatin Calcium) .... Take 1 Tab By Mouth Daily **needs Office Visit/lab** 10)  Fluticasone Propionate 50 Mcg/act Susp (Fluticasone Propionate) .... 2 Sprays Each Nostril Daily 11)  Naprosyn 500 Mg Tabs (Naproxen) .... Take 1 Tablet By Mouth Every 12 Hours As Needed Pain. Take With Food. 12)  Singulair 10 Mg Tabs (Montelukast Sodium) .... Take 1 Tablet By Mouth Once A Day For Allergies and Asthma 13)  Pepcid 20 Mg Tabs (Famotidine) .... Take 1 Tablet By Mouth Two Times A Day For 6 Weeks, Then Two Times A Day As Needed For Indigestion 14)  Triamcinolone Acetonide 0.1 % Crea (Triamcinolone Acetonide) .... Apply Two Times A Day To Affected Areas As Needed Itch--Use Scant Amount 15)  Optivar 0.05 % Soln (Azelastine Hcl) .... One Drop in Both Eyes Every 12 Hours 16)  Travatan Z 0.004 % Soln (Travoprost) .... One Drop in Both Eyes At Bedtime  Allergies (verified): 1)  ! Metrocream (Metronidazole)  Past History:  Past Medical History: Allergic rhinitis Asthma - Dx in [place] as an adult; did not smoke before dx. Hypertension Depression...chronic x 20 years/has had hallucinations in past;by family description some catatonic sxs in past. h/o endometriosis 11/1988 Hyperlipidemia Migraine Headaches GERD   a.  had EGD and tx for H. pylori in [place]  Physical Exam  General:  alert, well-developed, and well-nourished.   Head:  normocephalic and atraumatic.   Neck:  supple.   Lungs:  normal breath sounds and no wheezes.   Heart:  normal rate and regular rhythm.   Abdomen:  soft, non-tender, and no hepatomegaly.   Extremities:  no edema  Neurologic:  alert & oriented X3 and cranial nerves II-XII intact.   Psych:  normally interactive and good eye contact.     Impression & Recommendations:  Problem # 1:  GLUCOSE INTOLERANCE (ICD-271.3) send to dietician watch sugars consider metformin if A1C goes up   Orders: Hemoglobin A1C (83036)  Problem #  2:  FATTY LIVER DISEASE (ICD-571.8) treat glucose intol treat hyperlipidemia weight loss  Problem # 3:  EXAMINATION, ROUTINE MEDICAL (ICD-V70.0) never got CPP arranged will get labs, mammo and arrange CPP schedule colo   Orders: Mammogram (Screening) (Mammo) T-Hemoccult Cards-Multiple ([address]109) Gastroenterology Referral (GI) T-CBC w/Diff ([address]454-09811) T-TSH (91478-29562)  Problem # 4:  HYPERLIPIDEMIA (ICD-272.4) check lab  Her updated medication list for this problem includes:    Lipitor 20 Mg Tabs (Atorvastatin calcium) .Marland Kitchen... Take 1 tab by mouth daily **needs office visit/lab**  Orders: T-Lipid Profile (13086-57846) T-Comprehensive Metabolic Panel (96295-28413)  Problem # 5:  HYPERTENSION (  ICD-401.9) usually well controlled compliant with meds no change at this time  Her updated medication list for this problem includes:    Cozaar 50 Mg Tabs (Losartan potassium) .Marland Kitchen... Take 1 tablet by mouth every morning    Aldactone 25 Mg Tabs (Spironolactone) .Marland Kitchen... Take 1 tablet by mouth once a day  Orders: T-Comprehensive Metabolic Panel [phone]) T-CBC w/Diff (09811-91478) T-TSH (29562-13086) T-Urinalysis (57846-96295)  Problem # 6:  ASTHMA (ICD-493.90) controlled  Her updated medication list for this problem includes:    Advair Diskus 500-50 Mcg/dose Misc (Fluticasone-salmeterol) .Marland Kitchen... 1 puff 2 times daily    Ventolin Hfa 108 (90 Base) Mcg/act Aers (Albuterol sulfate) ..... Use 2 puffs every 4-6 hours as needed for cough/shortness of breath    Singulair 10 Mg Tabs (Montelukast sodium) .Marland Kitchen... Take 1 tablet by mouth once a day for allergies and asthma  Problem # 7:  GERD (ICD-530.81) Taking pepcid once daily no symptoms if she is taking the medicine feels much better  Her updated medication list for this problem includes:    Pepcid 20 Mg Tabs (Famotidine) .Marland Kitchen... Take 1 tablet by mouth two times a day for 6 weeks, then two times a day as needed for indigestion  Problem #  8:  MIGRAINE HEADACHE (ICD-346.90) right sided atypical migraines since age 2 occurs rarely  Her updated medication list for this problem includes:    Naprosyn 500 Mg Tabs (Naproxen) .Marland Kitchen... Take 1 tablet by mouth every 12 hours as needed pain. take with food.  Problem # 9:  GLAUCOMA (ICD-365.9) needs drops  . . . I will refill for her she is encouraged to f/u with Dr. Nile Riggs to make sure her eyes are better  Problem # 10:  ALLERGIC REACTION, ACUTE (ICD-995.3) Assessment: Improved resolved may have been from metrogel suggest she use benzoyl peroxide for any acne like lesions in future  Complete Medication List: 1)  Advair Diskus 500-50 Mcg/dose Misc (Fluticasone-salmeterol) .Marland Kitchen.. 1 puff 2 times daily 2)  Cozaar 50 Mg Tabs (Losartan potassium) .... Take 1 tablet by mouth every morning 3)  Remeron 45 Mg Tabs (Mirtazapine) .... Take 1 tablet by mouth once a day 4)  Aldactone 25 Mg Tabs (Spironolactone) .... Take 1 tablet by mouth once a day 5)  Ventolin Hfa 108 (90 Base) Mcg/act Aers (Albuterol sulfate) .... Use 2 puffs every 4-6 hours as needed for cough/shortness of breath 6)  Alprazolam 0.5 Mg Tabs (Alprazolam) .... Take 1/2-1  tablet by mouth once daily as needed 7)  Allegra 180 Mg Tabs (Fexofenadine hcl) .... Take 1 tablet by mouth once a day as needed for allergy symptoms 8)  Lexapro 20 Mg Tabs (Escitalopram oxalate) .Marland Kitchen.. 1 by mouth daily 9)  Lipitor 20 Mg Tabs (Atorvastatin calcium) .... Take 1 tab by mouth daily **needs office visit/lab** 10)  Fluticasone Propionate 50 Mcg/act Susp (Fluticasone propionate) .... 2 sprays each nostril daily 11)  Naprosyn 500 Mg Tabs (Naproxen) .... Take 1 tablet by mouth every 12 hours as needed pain. take with food. 12)  Singulair 10 Mg Tabs (Montelukast sodium) .... Take 1 tablet by mouth once a day for allergies and asthma 13)  Pepcid 20 Mg Tabs (Famotidine) .... Take 1 tablet by mouth two times a day for 6 weeks, then two times a day as needed for  indigestion 14)  Triamcinolone Acetonide 0.1 % Crea (Triamcinolone acetonide) .... Apply two times a day to affected areas as needed itch--use scant amount 15)  Travatan Z 0.004 % Soln (Travoprost) .Marland KitchenMarland KitchenMarland Kitchen  1 drop each eye at bedtime 16)  Optivar 0.05 % Soln (Azelastine hcl) .Marland Kitchen.. 1 drop in both eyes two times a day  Other Orders: Capillary Blood Glucose/CBG (16109)  Hypertension Assessment/Plan:      The patient's hypertensive risk group is category B: At least one risk factor (excluding diabetes) with no target organ damage.  Her calculated 10 year risk of coronary heart disease is 9 %.  Today's blood pressure is 145/77.  Her blood pressure goal is < 140/90.    Patient Instructions: 1)  Schedule appointment with Susie Piper for Glucose Intolerance. 2)  I faxed a prescription for Advair, Ventolin, Travatan and Optivar to [facility].  You can pick them up in about 3-4 days. 3)  Please schedule a follow-up appointment in 3 months with Scott for CPP. 4)  Make sure you follow up with Dr. Nile Riggs to check the pressure in your eyes.  Prescriptions: OPTIVAR 0.05 % SOLN (AZELASTINE HCL) 1 drop in both eyes two times a day  #1 bottle x 11   Entered and Authorized by:   Tereso Newcomer PA-C   Signed by:   Tereso Newcomer PA-C on 06/10/2010   Method used:   Faxed to ...       [facility] (retail)       [address] [place], [place]  [address]       Ph: [phone]       Fax: [phone]   RxID:   [id number] TRAVATAN Z 0.004 % SOLN (TRAVOPROST) 1 drop each eye at bedtime  #1 bottle x 11   Entered and Authorized by:   Tereso Newcomer PA-C   Signed by:   Tereso Newcomer PA-C on 06/10/2010   Method used:   Faxed to ...       [facility] (retail)       [address] [place], [place]  [address]       Ph: [phone]       Fax: [phone]   RxID:   [id number] VENTOLIN HFA 108 (90 BASE) MCG/ACT AERS  (ALBUTEROL SULFATE) Use 2 puffs every 4-6 hours as needed for cough/shortness of breath  #1 x 11   Entered and Authorized by:   Tereso Newcomer PA-C   Signed by:   Tereso Newcomer PA-C on 06/10/2010   Method used:   Faxed to ...       [facility] (retail)       [address] [place], [place]  [address]       Ph: [phone]       Fax: [phone]   RxID:   [id number] ADVAIR DISKUS 500-50 MCG/DOSE MISC (FLUTICASONE-SALMETEROL) 1 puff 2 times daily  #1 x 11   Entered and Authorized by:   Tereso Newcomer PA-C   Signed by:   Tereso Newcomer PA-C on 06/10/2010   Method used:   Faxed to ...       [facility] (retail)       [address] [place], [place]  [address]       Ph: [phone]       Fax: [phone]   RxID:   [id number]   Laboratory Results   Urine Tests    Routine Urinalysis   Glucose: negative   (  Normal Range: Negative) Bilirubin: negative   (Normal Range: Negative) Ketone: negative   (Normal Range: Negative) Spec. Gravity: 1.015   (Normal Range: 1.003-1.035) Blood: negative   (Normal Range: Negative) pH: 5.0   (Normal Range: 5.0-8.0) Protein: negative   (Normal Range: Negative) Urobilinogen: 0.2   (Normal Range: 0-1) Nitrite: negative   (Normal Range: Negative) Leukocyte Esterace: negative   (Normal Range: Negative)     Blood Tests     HGBA1C: 6.0%   (Normal Range: Non-Diabetic - 3-6%   Control Diabetic - 6-8%) CBG Random:: 108mg /dL

## 2010-12-15 NOTE — Assessment & Plan Note (Signed)
Summary: 2236 PT/FU PER Mckade Gurka///KT   Vital Signs:  Patient profile:   55 year old female Weight:      179 pounds Temp:     98.0 degrees F Pulse rate:   92 / minute Pulse rhythm:   regular Resp:     16 per minute BP sitting:   132 / 76  (left arm) Cuff size:   regular  Vitals Entered By: Vesta Mixer CMA (Mar 17, 2010 8:46 AM) CC: f/u on rosacea, prednisone is helping a lot. Is Patient Diabetic? No Pain Assessment Patient in pain? no       Does patient need assistance? Ambulation Normal   CC:  f/u on rosacea and prednisone is helping a lot.Marland Kitchen  History of Present Illness: 1.  Allergic Reaction to topical Metronidazole--much better now.  Swelling, worsening rash and itching much improved.  Last day of Prednisone was yesterday.  2.  Allergic Rhinitis, conjunctivitis:  Still with symptoms of cough and itchy eyes.--mucous is clear.  Not using Nasal corticosteroids--bitter taste in mouth--previously on Flonase, but receiving Nasocort and feels the latter giving her this taste side effect.  Is taking Singulair, Allegra regularly.  Allergies (verified): 1)  ! Metrocream (Metronidazole)  Physical Exam  Eyes:  Conjunctivae mildly injected Nose:  Clear discharge Lungs:  Normal respiratory effort, chest expands symmetrically. Lungs are clear to auscultation, no crackles or wheezes. Skin:  Forehead rash drying and minimally pink--elevated lesions almost gone--similar on right wrist.  Lesions on chest not involved with allergic reaction resolved.   Impression & Recommendations:  Problem # 1:  FOLLICULITIS (ICD-704.8) Do not see evidence of rosacea at this time--the few lesions that were not involved with allergic reaction to Metronidazole appeare to be a mild follicultis--to complete Doxycycline and then no further treatment for now.  Problem # 2:  ALLERGIC REACTION, ACUTE (ICD-995.3) Resolving nicely--topical Triamcinolone for remaining rash as needed   Problem # 3:   ALLERGIC RHINITIS (ICD-477.9) And conjuncitivitis--to add back a corticosteroid nasal spray and notify if does not improve Her updated medication list for this problem includes:    Allegra 180 Mg Tabs (Fexofenadine hcl) .Marland Kitchen... Take 1 tablet by mouth once a day as needed for allergy symptoms    Fluticasone Propionate 50 Mcg/act Susp (Fluticasone propionate) .Marland Kitchen... 2 sprays each nostril daily  Complete Medication List: 1)  Advair Diskus 500-50 Mcg/dose Misc (Fluticasone-salmeterol) .Marland Kitchen.. 1 puff 2 times daily 2)  Cozaar 50 Mg Tabs (Losartan potassium) .... Take 1 tablet by mouth every morning 3)  Remeron 45 Mg Tabs (Mirtazapine) .... Take 1 tablet by mouth once a day 4)  Aldactone 25 Mg Tabs (Spironolactone) .... Take 1 tablet by mouth once a day 5)  Ventolin Hfa 108 (90 Base) Mcg/act Aers (Albuterol sulfate) .... Use 2 puffs every 4-6 hours as needed for cough/shortness of breath 6)  Alprazolam 0.5 Mg Tabs (Alprazolam) .... Take 1/2-1  tablet by mouth once daily as needed 7)  Allegra 180 Mg Tabs (Fexofenadine hcl) .... Take 1 tablet by mouth once a day as needed for allergy symptoms 8)  Lexapro 20 Mg Tabs (Escitalopram oxalate) .Marland Kitchen.. 1 by mouth daily 9)  Lipitor 20 Mg Tabs (Atorvastatin calcium) .... Take 1 tab by mouth daily **needs office visit/lab** 10)  Fluticasone Propionate 50 Mcg/act Susp (Fluticasone propionate) .... 2 sprays each nostril daily 11)  Naprosyn 500 Mg Tabs (Naproxen) .... Take 1 tablet by mouth every 12 hours as needed pain. take with food. 12)  Singulair 10  Mg Tabs (Montelukast sodium) .... Take 1 tablet by mouth once a day for allergies and asthma 13)  Pepcid 20 Mg Tabs (Famotidine) .... Take 1 tablet by mouth two times a day for 6 weeks, then two times a day as needed for indigestion 14)  Triamcinolone Acetonide 0.1 % Crea (Triamcinolone acetonide) .... Apply two times a day to affected areas as needed itch--use scant amount  Patient Instructions: 1)  Follow up as needed  with Tereso Newcomer, PA Prescriptions: TRIAMCINOLONE ACETONIDE 0.1 % CREA (TRIAMCINOLONE ACETONIDE) Apply two times a day to affected areas as needed itch--use scant amount  #30 g x 0   Entered and Authorized by:   Julieanne Manson MD   Signed by:   Julieanne Manson MD on 03/17/2010   Method used:   Electronically to        [facility] (retail)       [address]       [place], [place]  [address]       Ph: [phone]       Fax: [phone]   RxID:   [id number] FLUTICASONE PROPIONATE 50 MCG/ACT SUSP (FLUTICASONE PROPIONATE) 2 sprays each nostril daily  #1 x 11   Entered and Authorized by:   Julieanne Manson MD   Signed by:   Julieanne Manson MD on 03/17/2010   Method used:   Faxed to ...       [facility] (retail)       [address] [place], [place]  [address]       Ph: [phone]       Fax: [phone]   RxID:   [id number]

## 2010-12-15 NOTE — Letter (Signed)
Summary: *HSN Results Follow up  [facility]  [address] [place], [place] [address]   Phone: [phone]  Fax: [phone]      12/07/2009   JANEYA DEYO [address] [place], [place]  [address]   Dear  Ms. Alaiah Bucholz,                            ____S.Drinkard,FNP   ____D. Gore,FNP       ____B. McPherson,MD   ____V. Rankins,MD    ____E. Mulberry,MD    ____[name], FNP  ____D. Reche Dixon, MD    ____K. Philipp Deputy, MD    _x__S. Alben Spittle, PA-C     This letter is to inform you that your recent test(s):  _______Pap Smear    ___x____Lab Test     _______X-ray    ___x____ is within acceptable limits  _______ requires a medication change  _______ requires a follow-up lab visit  _______ requires a follow-up visit with your provider   Comments:       _________________________________________________________ If you have any questions, please contact our office                     Sincerely,  Tereso Newcomer PA-C [facility]

## 2010-12-15 NOTE — Progress Notes (Signed)
Summary: GI referral  Phone Note Outgoing Call   Summary of Call: Refer to Dr. Victorino Dike for screening colonoscopy. Initial call taken by: Brynda Rim,  June 10, 2010 6:06 PM

## 2010-12-15 NOTE — Progress Notes (Signed)
Summary: Dr Erby Pian Office  Phone Note From Other Clinic   Summary of Call: Clydie Braun, from Dr Charna Busman office called in today,because they needs to call a new prescription for the glaucoma drops.  (Travatan Z) 1 drop in both eyes at bed time 3 refills ). [phone] Alben Spittle PA-c    Initial call taken by: Manon Hilding,  February 12, 2010 11:42 AM  Follow-up for Phone Call        spoke with [facility].Marland KitchenMarland KitchenI believet they were attempting to call in a rx to our pharmacy but Clydie Braun is in surgery today so they are unabe to verify. Her office will contact our office back after she claifires the rx. Follow-up by: Mikey College CMA,  February 13, 2010 11:55 AM  Additional Follow-up for Phone Call Additional follow up Details #1::        called the office and karen was gone to lunch.... Arm[place] Shannon  February 17, 2010 12:17 PM     Additional Follow-up for Phone Call Additional follow up Details #2::    spoke with Clydie Braun and she says she spoke with Victorino Dike from [employer] and they got the rx for pt Follow-up by: [place] Shannon,  February 17, 2010 1:00 PM

## 2010-12-15 NOTE — Letter (Signed)
Summary: External Correspondence - [facility]  External Correspondence - [facility]   Imported By: Paula Libra 12/09/2009 15:42:39  _____________________________________________________________________  External Attachment:    Type:   Image     Comment:   External Document  Appended Document: Orders Update Patient needs referral to ophthalmology. She may have glaucoma. We need to get [facility] to [facility] or Dr. Mitzi Davenport. Referral in system.   pt says she doesn't have the money right. 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

## 2010-12-24 ENCOUNTER — Other Ambulatory Visit: Payer: Self-pay | Admitting: Nurse Practitioner

## 2010-12-24 ENCOUNTER — Encounter: Payer: Self-pay | Admitting: Nurse Practitioner

## 2010-12-24 ENCOUNTER — Encounter (INDEPENDENT_AMBULATORY_CARE_PROVIDER_SITE_OTHER): Payer: Self-pay | Admitting: Nurse Practitioner

## 2010-12-24 DIAGNOSIS — K439 Ventral hernia without obstruction or gangrene: Secondary | ICD-10-CM | POA: Insufficient documentation

## 2010-12-24 DIAGNOSIS — M899 Disorder of bone, unspecified: Secondary | ICD-10-CM | POA: Insufficient documentation

## 2010-12-24 DIAGNOSIS — M949 Disorder of cartilage, unspecified: Secondary | ICD-10-CM | POA: Insufficient documentation

## 2010-12-24 LAB — CONVERTED CEMR LAB
Blood in Urine, dipstick: NEGATIVE
Glucose, Urine, Semiquant: NEGATIVE
Nitrite: NEGATIVE
Protein, U semiquant: NEGATIVE
Urobilinogen, UA: 0.2
pH: 5

## 2010-12-25 LAB — CONVERTED CEMR LAB
BUN: 11 mg/dL (ref 6–23)
Basophils Absolute: 0 10*3/uL (ref 0.0–0.1)
Calcium: 8.9 mg/dL (ref 8.4–10.5)
Chloride: 105 meq/L (ref 96–112)
Eosinophils Relative: 1 % (ref 0–5)
Hemoglobin: 14 g/dL (ref 12.0–15.0)
Hgb A1c MFr Bld: 6.3 % — ABNORMAL HIGH (ref <5.7)
Lymphs Abs: 2.6 10*3/uL (ref 0.7–4.0)
MCHC: 32 g/dL (ref 30.0–36.0)
MCV: 93.8 fL (ref 78.0–100.0)
Monocytes Absolute: 0.4 10*3/uL (ref 0.1–1.0)
Monocytes Relative: 4 % (ref 3–12)
Neutro Abs: 7.9 10*3/uL — ABNORMAL HIGH (ref 1.7–7.7)
Neutrophils Relative %: 72 % (ref 43–77)
Platelets: 255 10*3/uL (ref 150–400)
RBC: 4.66 M/uL (ref 3.87–5.11)
Sodium: 143 meq/L (ref 135–145)

## 2010-12-28 ENCOUNTER — Encounter (INDEPENDENT_AMBULATORY_CARE_PROVIDER_SITE_OTHER): Payer: Self-pay | Admitting: Nurse Practitioner

## 2010-12-31 NOTE — Progress Notes (Signed)
Summary: Office Visit//DEPRESSION SCREENING  Office Visit//DEPRESSION SCREENING   Imported By: Arta Bruce 12/25/2010 11:54:58  _____________________________________________________________________  External Attachment:    Type:   Image     Comment:   External Document

## 2010-12-31 NOTE — Assessment & Plan Note (Signed)
Summary: Complete Physical Exam   Vital Signs:  Patient profile:   55 year old female Menstrual status:  partial hysterectomy Weight:      188.4 pounds BMI:     36.93 Temp:     97.5 degrees F oral Pulse rate:   72 / minute Pulse rhythm:   regular Resp:     16 per minute BP sitting:   126 / 80  (left arm) Cuff size:   regular  Vitals Entered By: Levon Hedger (December 24, 2010 10:58 AM)  Nutrition Counseling: Patient's BMI is greater than 25 and therefore counseled on weight management options. CC: CPP, Hypertension Management, Lipid Management, Abdominal Pain Is Patient Diabetic? No Pain Assessment Patient in pain? no       Does patient need assistance? Functional Status Self care Ambulation Normal  Vision Screening:Left eye w/o correction: 20 / 25+4 Right Eye w/o correction: 20 / 25+5 Both eyes w/o correction:  20/ 20        Vision Entered By: Levon Hedger (December 24, 2010 11:07 AM) LMP - Character: partial hysterectomy     Menstrual Status partial hysterectomy Last PAP Result NEGATIVE FOR INTRAEPITHELIAL LESIONS OR MALIGNANCY.   Primary Care Provider:  Tereso Newcomer PA-C  CC:  CPP, Hypertension Management, Lipid Management, and Abdominal Pain.  History of Present Illness:  Pt into the office for a complete physcial exam  PAP - last done 1 year ago.  No history of abnormal PAP Pt is s/p partial hysterectomy (ovaries removed but cervix remained) Menses stopped  in 2003 Mother with cervical cancer Married for the past 35 years  Mammogram - last done in 06/2010 No family history of breast cancer no self breast exams at home  Optho - wears reading glasses. pt has been to optho - she has glaucoma but has not been to f/u due to finances  Dental - regular dental appts with an upcoming cleaning appt  Daugher present today to interpret  Dyspepsia History:      She has no alarm features of dyspepsia including no history of melena, hematochezia,  dysphagia, persistent vomiting, or involuntary weight loss > 5%.  There is a prior history of GERD.  The patient does not have a prior history of documented ulcer disease.  The dominant symptom is not heartburn or acid reflux.  An H-2 blocker medication is currently being taken.  She notes that the symptoms have not improved with the H-2 blocker therapy.  Symptoms have not persisted after 4 weeks of H-2 blocker treatment.    Hypertension History:      She denies headache, chest pain, and palpitations.  She notes no problems with any antihypertensive medication side effects.        Positive major cardiovascular risk factors include hyperlipidemia and hypertension.  Negative major cardiovascular risk factors include female age less than 80 years old, no history of diabetes, negative family history for ischemic heart disease, and non-tobacco-user status.        Further assessment for target organ damage reveals no history of ASHD, stroke/TIA, peripheral vascular disease, renal insufficiency, or hypertensive retinopathy.    Lipid Management History:      Positive NCEP/ATP III risk factors include hypertension.  Negative NCEP/ATP III risk factors include female age less than 9 years old, non-diabetic, no family history for ischemic heart disease, non-tobacco-user status, no ASHD (atherosclerotic heart disease), no prior stroke/TIA, no peripheral vascular disease, and no history of aortic aneurysm.  The patient states that she does not know about the "Therapeutic Lifestyle Change" diet.  The patient does not know about adjunctive measures for cholesterol lowering.  She expresses no side effects from her lipid-lowering medication.  The patient denies any symptoms to suggest myopathy or liver disease.       Habits & Providers  Alcohol-Tobacco-Diet     Alcohol drinks/day: 0     Tobacco Status: quit     Tobacco Counseling: to quit use of tobacco products     Year Quit:  2006  Exercise-Depression-Behavior     Does Patient Exercise: no     Have you felt down or hopeless? no     Have you felt little pleasure in things? no     Depression Counseling: not indicated; screening negative for depression     Drug Use: never  Comments: PHQ- 9 score = 12  Allergies (verified): 1)  ! Metrocream (Metronidazole)  Social History: Drug Use:  never Does Patient Exercise:  no  Review of Systems General:  Denies fever. Eyes:  Complains of itching; denies discharge. ENT:  Denies earache. CV:  Complains of swelling of feet. Resp:  Denies cough. GI:  Denies abdominal pain, nausea, and vomiting. GU:  Denies discharge. MS:  Denies joint pain. Derm:  Denies dryness. Neuro:  Denies headaches. Psych:  Denies depression.  Physical Exam  General:  alert.   Head:  normocephalic.   Eyes:  pupils equal and pupils round.   Ears:  R ear normal and L ear normal.   Nose:  no nasal discharge.   Mouth:  fair dentition.   Neck:  supple.   Chest Wall:  no mass.   Breasts:  skin/areolae normal.   Lungs:  normal breath sounds.   Heart:  normal rate and regular rhythm.   Abdomen:  ventral hernia obese BS x 4 Rectal:  external hemorrhoid(s).   Pulses:  R radial normal and L radial normal.   Extremities:  1+ left pedal edema and 1+ right pedal edema.   Neurologic:  alert & oriented X3.   Skin:  color normal.   Psych:  Oriented X3.    Pelvic Exam  Vulva:      normal appearance.   Urethra and Bladder:      Urethra--no discharge.   Vagina:      physiologic discharge.   Cervix:      midposition.   Uterus:      smooth.   Adnexa:      nontender bilaterally.   Rectum:      normal, heme negative stool.      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 Take 1 tablet by mouth every morning    Aldactone 25 Mg Tabs (Spironolactone) .Marland Kitchen... Take 1 tablet by mouth once a day    Furosemide  20 Mg Tabs (Furosemide) ..... One tablet by mouth daily as needed for fluid  Orders: UA Dipstick w/o Micro (manual) (91478) T-Urine Microalbumin w/creat. ratio [phone]) EKG w/ Interpretation (93000)  Problem # 6:  HYPERLIPIDEMIA (ICD-272.4)  Her updated medication list for this problem includes:    Lipitor 20 Mg Tabs (Atorvastatin calcium) .Marland Kitchen... Take 1 tab by mouth daily  Complete Medication List: 1)  Advair Diskus 500-50 Mcg/dose Misc (Fluticasone-salmeterol) .Marland Kitchen.. 1 puff 2 times daily 2)  Cozaar 50 Mg Tabs (Losartan potassium) .... Take 1 tablet by mouth every morning 3)  Remeron 45 Mg Tabs (Mirtazapine) .... Take 1 tablet by mouth once a day 4)  Aldactone 25 Mg Tabs (Spironolactone) .... Take 1 tablet by mouth once a day 5)  Ventolin Hfa 108 (90 Base) Mcg/act Aers (Albuterol sulfate) .... Use 2 puffs every 4-6 hours as needed for cough/shortness of breath 6)  Alprazolam 0.5 Mg Tabs (Alprazolam) .... Take 1/2-1  tablet by mouth once daily as needed 7)  Loratadine 10 Mg Tabs (Loratadine) .... Take 1 tablet by mouth once a day as needed 8)  Lexapro 20 Mg Tabs (Escitalopram oxalate) .Marland Kitchen.. 1 by mouth daily 9)  Lipitor 20 Mg Tabs (Atorvastatin calcium) .... Take 1 tab by mouth daily 10)  Fluticasone Propionate 50 Mcg/act Susp (Fluticasone  propionate) .... 2 sprays each nostril daily 11)  Naprosyn 500 Mg Tabs (Naproxen) .... Take 1 tablet by mouth every 12 hours as needed pain. take with food. 12)  Singulair 10 Mg Tabs (Montelukast sodium) .... Take 1 tablet by mouth once a day for allergies and asthma 13)  Pepcid 20 Mg Tabs (Famotidine) .... Take 1 tablet by mouth two times a day for 6 weeks, then two times a day as needed for indigestion 14)  Triamcinolone Acetonide 0.1 % Crea (Triamcinolone acetonide) .... Apply two times a day to affected areas as needed itch--use scant amount 15)  Travatan Z 0.004 % Soln (Travoprost) .Marland Kitchen.. 1 drop each eye at bedtime 16)  Furosemide 20 Mg Tabs (Furosemide) .... One tablet by mouth daily as needed for fluid 17)  Patanol 0.1 % Soln (Olopatadine hcl) .... One drop in each eye two times a day  Other Orders: T-CBC w/Diff ([id number])  Hypertension Assessment/Plan:      The patient's hypertensive risk group is category B: At least one risk factor (excluding diabetes) with no target organ damage.  Her calculated 10 year risk of coronary heart disease is 6 %.  Today's blood pressure is 126/80.  Her blood pressure goal is < 140/90.  Lipid Assessment/Plan:      Based on NCEP/ATP III, the patient's risk factor category is "0-1 risk factors".  The patient's lipid goals are as follows: Total cholesterol goal is 200; LDL cholesterol goal is 160; HDL cholesterol goal is 40; Triglyceride goal is 150.    Patient Instructions: 1)  You will be notififed of the lab results 2)  Swelling - be sure to monitor the salt in your diet. Extra fluid comes from prolonged sitting. 3)  Take furosemide 20mg  by mouth daily x 3 days then as needed for swelling. 4)  Hernia - You have a hernia in your stomach.  This is caused by the increased fat in your stomach over many years.  you should start to exercise to lose weight in your mid-section. 5)  Follow up ever 6 months or as needed for chronic issues Prescriptions: PATANOL  0.1 % SOLN (OLOPATADINE HCL) One drop in each eye  two times a day  #7.79ml x 0   Entered and Authorized by:   Lehman Prom FNP   Signed by:   Lehman Prom FNP on 12/24/2010   Method used:   Print then Give to Patient   RxID:   [id number] FUROSEMIDE 20 MG TABS (FUROSEMIDE) One tablet by mouth daily as needed for fluid  #20 x 0   Entered and Authorized by:   Lehman Prom FNP   Signed by:   Lehman Prom FNP on 12/24/2010   Method used:   Print then Give to Patient   RxID:   [id number]    Orders Added: 1)  Est. Patient age 59-64 [99396] 2)  UA Dipstick w/o Micro (manual) [81002] 3)  T-Urine Microalbumin w/creat. ratio [[id number]] 4)  T-CBC w/Diff [84696-29528] 5)  Pap Smear, Thin Prep ( Collection of) [Q0091] 6)  KOH/ WET Mount [87210] 7)  EKG w/ Interpretation [93000] 8)  Hemoccult Guaiac-1 spec.(in office) [82270] 9)  Vision Screening [99173] 10)  T- Hemoglobin A1C [83036-23375] 11)  T-Basic Metabolic Panel [[id number]]    Laboratory Results   Urine Tests  Date/Time Received: December 24, 2010 11:08 AM   Routine Urinalysis   Color: lt. yellow Appearance: Clear Glucose: negative   (Normal Range: Negative) Bilirubin: negative   (Normal Range: Negative) Ketone: negative   (Normal Range: Negative) Spec. Gravity: <1.005   (Normal Range: 1.003-1.035) Blood: negative   (Normal Range: Negative) pH: 5.0   (Normal Range: 5.0-8.0) Protein: negative   (Normal Range: Negative) Urobilinogen: 0.2   (Normal Range: 0-1) Nitrite: negative   (Normal Range: Negative) Leukocyte Esterace: negative   (Normal Range: Negative)    Date/Time Received: December 24, 2010 1:21 PM   Wet Mount/KOH Source: vaginal WBC/hpf: 1-5 Bacteria/hpf: rare Clue cells/hpf: none Yeast/hpf: none Trichomonas/hpf: none  Stool - Occult Blood Hemmoccult #1: negative Date: 12/24/2010      Osteoporosis  Patient complains of: kyphosis No back pain No prior fracture  No height loss Yes  Patient reports: Personal history of fracture No Hx of Fx in a 1st degree relative No Caucasian/Asian Race No Advanced Age No Female Gender Yes Dementia No Poor health/fragility No Current cigarette smoker No Low body weight (<127 lbs) No Low calcium intake (lifelong) No Alcoholism No Inadequate physical activity Yes Poor eyesight/risk of falls No   Laboratory Results   Urine Tests    Routine Urinalysis   Color: lt. yellow Appearance: Clear Glucose: negative   (Normal Range: Negative) Bilirubin: negative   (Normal Range: Negative) Ketone: negative   (Normal Range: Negative) Spec. Gravity: <1.005   (Normal Range: 1.003-1.035) Blood: negative   (Normal Range: Negative) pH: 5.0   (Normal Range: 5.0-8.0) Protein: negative   (Normal Range: Negative) Urobilinogen: 0.2   (Normal Range: 0-1) Nitrite: negative   (Normal Range: Negative) Leukocyte Esterace: negative   (Normal Range: Negative)      Wet Mount Wet Mount KOH: Negative  Stool - Occult Blood Hemmoccult #1: negative     EKG  Procedure date:  12/24/2010  Findings:      NSR

## 2011-01-06 NOTE — Letter (Signed)
Summary: *HSN Results Follow up  [facility]  [address] [place], [place] [address]   Phone: [phone]  Fax: [phone]      12/28/2010   Casey Powell [address] [place], [place]  [address]   Dear  Ms. [name],                            ____S.Drinkard,FNP   ____D. Gore,FNP       ____B. McPherson,MD   ____V. Rankins,MD    ____E. Mulberry,MD    _X___[name], FNP  ____D. Reche Dixon, MD    ____K. Philipp Deputy, MD    ____Other     This letter is to inform you that your recent test(s):  ___X____Pap Smear    _______Lab Test     _______X-ray    ___X____ is within acceptable limits  _______ requires a medication change  _______ requires a follow-up lab visit  _______ requires a follow-up visit with your provider   Comments: PAP smear is normal.       _________________________________________________________ If you have any questions, please contact our office [phone].                    Sincerely,    Lehman Prom FNP [facility]

## 2011-01-07 ENCOUNTER — Telehealth (INDEPENDENT_AMBULATORY_CARE_PROVIDER_SITE_OTHER): Payer: Self-pay | Admitting: Nurse Practitioner

## 2011-01-12 NOTE — Progress Notes (Signed)
Summary: Patanol too expensive  Phone Note Call from Patient   Summary of Call: pt called to say that the PATANOL 0.1 % SOLN cost too much money at [facility].... pt would like to know if she can get something else cheaper at [facility]...Marland Kitchen please call pt to let her know  Initial call taken by: [place] Shannon,  January 07, 2011 10:56 AM  Follow-up for Phone Call        Sent to [name].  Dutch Quint RN  January 07, 2011 2:02 PM   Additional Follow-up for Phone Call Additional follow up Details #1::        See if [facility] [facility] can get the patanol Pt reported she had tried all others and they were not effective when she was being seen by Lorin Picket. I can change her back to any one those if she prefers one or the other (if [facility] can't get it)  Additional Follow-up by: Lehman Prom FNP,  January 07, 2011 2:50 PM    Additional Follow-up for Phone Call Additional follow up Details #2::    [facility] pharmacy doesn't carry patanol.  Dutch Quint RN  January 07, 2011 3:06 PM  ok, inform pt and ask if she wants to switch back to one of the previous drops she was using such as optivar.  They are all equally expensive.  She can also try to use either visine, red eyes or any of the over the counter preparations n.martin,fnp January 07, 2011  3:33 PM  Pt.'s daughter advised of provider's response.  States Optivar was not effective.  Will go to pharmacy and look at OTC remedies.  Advised to speak with pharmacist there if she has any questions for choosing a product.  Verbalized understanding and agreement.  Dutch Quint RN  January 08, 2011 5:19 PM

## 2011-01-28 ENCOUNTER — Encounter (INDEPENDENT_AMBULATORY_CARE_PROVIDER_SITE_OTHER): Payer: Self-pay | Admitting: Nurse Practitioner

## 2011-01-28 LAB — CONVERTED CEMR LAB
Lymphocytes Relative: 30 % (ref 12–46)
Lymphs Abs: 3 10*3/uL (ref 0.7–4.0)
MCHC: 31.1 g/dL (ref 30.0–36.0)
Monocytes Absolute: 0.6 10*3/uL (ref 0.1–1.0)
Monocytes Relative: 5 % (ref 3–12)
Neutrophils Relative %: 63 % (ref 43–77)
RDW: 13.4 % (ref 11.5–15.5)
WBC: 10.3 10*3/uL (ref 4.0–10.5)

## 2011-02-02 NOTE — Assessment & Plan Note (Signed)
Summary: Repeat CBC  Nurse Visit   Allergies: 1)  ! Metrocream (Metronidazole)  Orders Added: 1)  Est. Patient Level I [60454] 2)  T-CBC w/Diff [[id number]]

## 2011-04-20 ENCOUNTER — Other Ambulatory Visit (HOSPITAL_COMMUNITY): Payer: Self-pay | Admitting: Family Medicine

## 2011-04-20 ENCOUNTER — Ambulatory Visit (HOSPITAL_COMMUNITY)
Admission: RE | Admit: 2011-04-20 | Discharge: 2011-04-20 | Disposition: A | Discharge status: Home / Self Care | Payer: Self-pay | Source: Ambulatory Visit | Attending: Family Medicine | Admitting: Family Medicine

## 2011-04-20 DIAGNOSIS — M25539 Pain in unspecified wrist: Secondary | ICD-10-CM | POA: Insufficient documentation

## 2011-04-20 DIAGNOSIS — R52 Pain, unspecified: Secondary | ICD-10-CM

## 2011-04-20 DIAGNOSIS — M25639 Stiffness of unspecified wrist, not elsewhere classified: Secondary | ICD-10-CM | POA: Insufficient documentation

## 2011-11-07 ENCOUNTER — Encounter: Payer: Self-pay | Admitting: *Deleted

## 2011-11-07 ENCOUNTER — Emergency Department (HOSPITAL_COMMUNITY): Payer: Self-pay

## 2011-11-07 ENCOUNTER — Other Ambulatory Visit: Payer: Self-pay

## 2011-11-07 ENCOUNTER — Emergency Department (HOSPITAL_COMMUNITY)
Admission: EM | Admit: 2011-11-07 | Discharge: 2011-11-07 | Disposition: A | Discharge status: Home / Self Care | Payer: Self-pay | Attending: Emergency Medicine | Admitting: Emergency Medicine

## 2011-11-07 DIAGNOSIS — J9801 Acute bronchospasm: Secondary | ICD-10-CM

## 2011-11-07 DIAGNOSIS — R0602 Shortness of breath: Secondary | ICD-10-CM | POA: Insufficient documentation

## 2011-11-07 DIAGNOSIS — R059 Cough, unspecified: Secondary | ICD-10-CM | POA: Insufficient documentation

## 2011-11-07 DIAGNOSIS — J40 Bronchitis, not specified as acute or chronic: Secondary | ICD-10-CM

## 2011-11-07 DIAGNOSIS — R05 Cough: Secondary | ICD-10-CM | POA: Insufficient documentation

## 2011-11-07 DIAGNOSIS — F17200 Nicotine dependence, unspecified, uncomplicated: Secondary | ICD-10-CM | POA: Insufficient documentation

## 2011-11-07 HISTORY — DX: Hyperlipidemia, unspecified: E78.5

## 2011-11-07 HISTORY — DX: Gastro-esophageal reflux disease without esophagitis: K21.9

## 2011-11-07 HISTORY — DX: Essential (primary) hypertension: I10

## 2011-11-07 LAB — DIFFERENTIAL
Basophils Relative: 1 % (ref 0–1)
Eosinophils Absolute: 0 10*3/uL (ref 0.0–0.7)
Monocytes Absolute: 0.7 10*3/uL (ref 0.1–1.0)
Monocytes Relative: 9 % (ref 3–12)
Neutrophils Relative %: 48 % (ref 43–77)

## 2011-11-07 LAB — CBC
HCT: 45.3 % (ref 36.0–46.0)
Hemoglobin: 15.3 g/dL — ABNORMAL HIGH (ref 12.0–15.0)
MCH: 30.6 pg (ref 26.0–34.0)
MCHC: 33.8 g/dL (ref 30.0–36.0)
RDW: 13 % (ref 11.5–15.5)

## 2011-11-07 MED ORDER — ALBUTEROL SULFATE (5 MG/ML) 0.5% IN NEBU
5.0000 mg | INHALATION_SOLUTION | Freq: Once | RESPIRATORY_TRACT | Status: AC
  Administered 2011-11-07: 5 mg via RESPIRATORY_TRACT
  Filled 2011-11-07: qty 1

## 2011-11-07 MED ORDER — ALBUTEROL SULFATE HFA 108 (90 BASE) MCG/ACT IN AERS
2.0000 | INHALATION_SPRAY | RESPIRATORY_TRACT | Status: DC
  Administered 2011-11-07: 2 via RESPIRATORY_TRACT
  Filled 2011-11-07: qty 6.7

## 2011-11-07 MED ORDER — MOXIFLOXACIN HCL 400 MG PO TABS
400.0000 mg | ORAL_TABLET | Freq: Once | ORAL | Status: AC
  Administered 2011-11-07: 400 mg via ORAL
  Filled 2011-11-07: qty 1

## 2011-11-07 MED ORDER — MOXIFLOXACIN HCL 400 MG PO TABS: 400.0000 mg | ORAL_TABLET | Freq: Every day | ORAL | Status: AC

## 2011-11-07 NOTE — ED Provider Notes (Addendum)
History     CSN: [id number]  Arrival date & time 11/07/11  1946   First MD Initiated Contact with Patient 11/07/11 2034      Chief Complaint  Patient presents with  . Shortness of Breath     Patient is a 55 y.o. female presenting with shortness of breath. The history is provided by the patient. A language interpreter was used (Family member used as a Nurse, learning disability).  Shortness of Breath  Associated symptoms include shortness of breath.   The patient reports several days of coughing congestion and mild shortness of breath.  She has no prior history of asthma or COPD.  She reports productive cough and feels like she needs to cough but can't bring everything up.  She denies chest pain.  She denies nausea vomiting or abdominal pain.  She reports chills without fever.  She denies sore throat.  She denies myalgias.  She has recent sick contacts with another member in her house being ill last week.  Nothing worsens her symptoms.  Nothing improves her symptoms.  Symptoms are constant Past Medical History  Diagnosis Date  . Asthma   . Hyperlipidemia   . Hypertension   . GERD (gastroesophageal reflux disease)     Past Surgical History  Procedure Date  . Abdominal hysterectomy     Family History  Problem Relation Age of Onset  . Alzheimer's disease Mother   . Cancer Mother   . Stroke Father     History  Substance Use Topics  . Smoking status: Current Everyday Smoker  . Smokeless tobacco: Not on file  . Alcohol Use: No    OB History    Grav Para Term Preterm Abortions TAB SAB Ect Mult Living                  Review of Systems  Respiratory: Positive for shortness of breath.   All other systems reviewed and are negative.    Allergies  Metronidazole  Home Medications   Current Outpatient Rx  Name Route Sig Dispense Refill  . ALBUTEROL SULFATE HFA 108 (90 BASE) MCG/ACT IN AERS Inhalation Inhale 2 puffs into the lungs every 6 (six) hours as needed. For shortness of  breath     . ESCITALOPRAM OXALATE 10 MG PO TABS Oral Take 10 mg by mouth daily.      Marland Kitchen FLUTICASONE-SALMETEROL 500-50 MCG/DOSE IN AEPB Inhalation Inhale 1 puff into the lungs every 12 (twelve) hours.      Marland Kitchen LORATADINE 10 MG PO TABS Oral Take 10 mg by mouth daily.      Marland Kitchen LOSARTAN POTASSIUM 25 MG PO TABS Oral Take 25 mg by mouth daily.      Marland Kitchen MIRTAZAPINE 15 MG PO TABS Oral Take 15 mg by mouth at bedtime.      Marland Kitchen MONTELUKAST SODIUM 10 MG PO TABS Oral Take 10 mg by mouth at bedtime.      . OMEPRAZOLE 20 MG PO CPDR Oral Take 20 mg by mouth daily.      Marland Kitchen PRAVASTATIN SODIUM 40 MG PO TABS Oral Take 40 mg by mouth daily.      . TRAVOPROST (BAK FREE) 0.004 % OP SOLN Both Eyes Place 1 drop into both eyes at bedtime.      Marland Kitchen MOXIFLOXACIN HCL 400 MG PO TABS Oral Take 1 tablet (400 mg total) by mouth daily. 7 tablet 0    BP 138/69  Pulse 78  Temp(Src) 98 F (36.7 C) (Oral)  Resp 20  SpO2 97%  Physical Exam  Nursing note and vitals reviewed. Constitutional: She is oriented to person, place, and time. She appears well-developed and well-nourished. No distress.  HENT:  Head: Normocephalic and atraumatic.  Eyes: EOM are normal.  Neck: Normal range of motion.  Cardiovascular: Normal rate, regular rhythm and normal heart sounds.   Pulmonary/Chest: Effort normal and breath sounds normal.  Abdominal: Soft. She exhibits no distension. There is no tenderness.  Musculoskeletal: Normal range of motion. She exhibits no edema.  Neurological: She is alert and oriented to person, place, and time.  Skin: Skin is warm and dry.  Psychiatric: She has a normal mood and affect. Judgment normal.    ED Course  Procedures (including critical care time)   Date: 11/07/2011  Rate: 80  Rhythm: normal sinus rhythm  QRS Axis: normal  Intervals: normal  ST/T Wave abnormalities: normal  Conduction Disutrbances:none  Narrative Interpretation:   Old EKG Reviewed: No old ecg    Labs Reviewed  CBC - Abnormal; Notable  for the following:    Hemoglobin 15.3 (*)    All other components within normal limits  DIFFERENTIAL  PRO B NATRIURETIC PEPTIDE  POCT I-STAT TROPONIN I  I-STAT TROPONIN I  I-STAT, CHEM 8   Dg Chest 2 View  11/07/2011  *RADIOLOGY REPORT*  Clinical Data: Chest pain, shortness of breath, fever, cough, chest and back pressure  CHEST - 2 VIEW  Comparison: None  Findings: Upper-normal size of cardiac silhouette. Mediastinal contours and pulmonary vascularity normal. Minimal peribronchial thickening. Atelectasis left base. No pulmonary infiltrate, pleural effusion or pneumothorax. Bones demineralized  IMPRESSION: Bronchitic changes with minimal left basilar atelectasis.  Original Report Authenticated By: Lollie Marrow, M.D.   I personally reviewed the x-ray  1. Bronchitis   2. Bronchospasm       MDM  Well-appearing.  The symptoms are consistent with bronchitis.  There is a question of atelectasis in the left lower base.  Given this finding and her symptoms I will cover her with Avelox for possibly early community acquired pneumonia.  The patient feels much better after albuterol in the emergency department.  She'll be discharged home with albuterol inhaler for likely bronchospasm        Lyanne Co, MD 11/07/11 2130  Lyanne Co, MD 11/07/11 2329

## 2011-11-07 NOTE — ED Notes (Addendum)
C/o sob, fever cough & weak, recent cold sx, also chest and upper back pain. onset ~ 3d ago, "unable to cough anything up".  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 Daughter translating at [facility] ([place]). Some pedal edema noted, denies known CHF hx.

## 2015-01-15 ENCOUNTER — Encounter (HOSPITAL_COMMUNITY): Payer: Self-pay

## 2015-01-15 ENCOUNTER — Emergency Department (INDEPENDENT_AMBULATORY_CARE_PROVIDER_SITE_OTHER)
Admission: EM | Admit: 2015-01-15 | Discharge: 2015-01-15 | Disposition: A | Discharge status: Home / Self Care | Payer: No Typology Code available for payment source | Source: Home / Self Care | Attending: Emergency Medicine | Admitting: Emergency Medicine

## 2015-01-15 DIAGNOSIS — L509 Urticaria, unspecified: Secondary | ICD-10-CM

## 2015-01-15 MED ORDER — DIPHENHYDRAMINE HCL 50 MG/ML IJ SOLN
25.0000 mg | Freq: Once | INTRAMUSCULAR | Status: AC
  Administered 2015-01-15: 25 mg via INTRAMUSCULAR

## 2015-01-15 MED ORDER — EPINEPHRINE 0.3 MG/0.3ML IJ SOAJ: 0.3000 mg | Freq: Once | INTRAMUSCULAR | Status: AC

## 2015-01-15 MED ORDER — DIPHENHYDRAMINE HCL 50 MG/ML IJ SOLN
INTRAMUSCULAR | Status: AC
  Filled 2015-01-15: qty 1

## 2015-01-15 MED ORDER — PREDNISONE 20 MG PO TABS
60.0000 mg | ORAL_TABLET | Freq: Once | ORAL | Status: AC
  Administered 2015-01-15: 60 mg via ORAL

## 2015-01-15 MED ORDER — FAMOTIDINE 20 MG PO TABS
ORAL_TABLET | ORAL | Status: AC
  Filled 2015-01-15: qty 1

## 2015-01-15 MED ORDER — HYDROXYZINE HCL 25 MG PO TABS: 25.0000 mg | ORAL_TABLET | Freq: Three times a day (TID) | ORAL | Status: DC | PRN

## 2015-01-15 MED ORDER — FAMOTIDINE 20 MG PO TABS
20.0000 mg | ORAL_TABLET | Freq: Once | ORAL | Status: AC
  Administered 2015-01-15: 20 mg via ORAL

## 2015-01-15 MED ORDER — PREDNISONE 20 MG PO TABS
ORAL_TABLET | ORAL | Status: AC
  Filled 2015-01-15: qty 3

## 2015-01-15 NOTE — ED Provider Notes (Signed)
CSN: [id number]     Arrival date & time 01/15/15  1858 History   First MD Initiated Contact with Patient 01/15/15 2025     Chief Complaint  Patient presents with  . Allergic Reaction   (Consider location/radiation/quality/duration/timing/severity/associated sxs/prior Treatment) HPI Comments: Patient reports that each late afternoon or evening for the past 2-3 weeks she has developed hives on face, neck, chest, arms and legs. States rash never occurs during the daytime. When this occurs, she usually takes a dose of benadryl and symptoms improve. Has tried at home to identify a trigger and has been unsuccessful. Has not made any changes in environment or medications. No recent illness. Endorses that she had similar symptoms many years ago while living in [place] and was skin tested at the time but does not recall what she responded to during testing. Symptoms do not seem to correspond to any medications taken or foods eaten.  Denies swelling of lips, tongue or throat. No difficulty breathing, speaking or swallowing. States rash is very pruritic. PCP: [facility] @ [facility]  Patient is a 59 y.o. female presenting with allergic reaction. The history is provided by the patient and a relative. The history is limited by a language barrier. A language interpreter was used.  Allergic Reaction   Past Medical History  Diagnosis Date  . Asthma   . Hyperlipidemia   . Hypertension   . GERD (gastroesophageal reflux disease)    Past Surgical History  Procedure Laterality Date  . Abdominal hysterectomy     Family History  Problem Relation Age of Onset  . Alzheimer's disease Mother   . Cancer Mother   . Stroke Father    History  Substance Use Topics  . Smoking status: Current Every Day Smoker  . Smokeless tobacco: Not on file  . Alcohol Use: No   OB History    No data available     Review of Systems  All other systems reviewed and are negative.   Allergies  Metronidazole  Home Medications    Prior to Admission medications   Medication Sig Start Date End Date Taking? Authorizing Provider  albuterol (PROVENTIL HFA;VENTOLIN HFA) 108 (90 BASE) MCG/ACT inhaler Inhale 2 puffs into the lungs every 6 (six) hours as needed. For shortness of breath    Yes Historical Provider, MD  cloNIDine (CATAPRES) 0.1 MG tablet Take 0.1 mg by mouth 2 (two) times daily.   Yes Historical Provider, MD  cyclobenzaprine (FLEXERIL) 10 MG tablet Take 10 mg by mouth 3 (three) times daily as needed for muscle spasms.   Yes Historical Provider, MD  escitalopram (LEXAPRO) 10 MG tablet Take 10 mg by mouth daily.     Yes Historical Provider, MD  Fluticasone-Salmeterol (ADVAIR) 500-50 MCG/DOSE AEPB Inhale 1 puff into the lungs every 12 (twelve) hours.     Yes Historical Provider, MD  levothyroxine (SYNTHROID, LEVOTHROID) 100 MCG tablet Take 100 mcg by mouth daily before breakfast.   Yes Historical Provider, MD  meloxicam (MOBIC) 15 MG tablet Take 15 mg by mouth daily.   Yes Historical Provider, MD  pravastatin (PRAVACHOL) 40 MG tablet Take 40 mg by mouth daily.     Yes Historical Provider, MD  Travoprost, BAK Free, (TRAVATAN) 0.004 % SOLN ophthalmic solution Place 1 drop into both eyes at bedtime.     Yes Historical Provider, MD  EPINEPHrine (EPIPEN 2-PAK) 0.3 mg/0.3 mL IJ SOAJ injection Inject 0.3 mLs (0.3 mg total) into the muscle once. As directed for severe allergic reaction 01/15/15  Lutricia Feil, PA  hydrOXYzine (ATARAX/VISTARIL) 25 MG tablet Take 1 tablet (25 mg total) by mouth 3 (three) times daily as needed for itching. 01/15/15   Audelia Hives Carlita Whitcomb, PA  loratadine (CLARITIN) 10 MG tablet Take 10 mg by mouth daily.      Historical Provider, MD  losartan (COZAAR) 25 MG tablet Take 25 mg by mouth daily.      Historical Provider, MD  mirtazapine (REMERON) 15 MG tablet Take 15 mg by mouth at bedtime.      Historical Provider, MD  montelukast (SINGULAIR) 10 MG tablet Take 10 mg by mouth at bedtime.       Historical Provider, MD  omeprazole (PRILOSEC) 20 MG capsule Take 20 mg by mouth daily.      Historical Provider, MD   BP 168/88 mmHg  Pulse 74  Temp(Src) 98.9 F (37.2 C) (Oral)  Resp 20  SpO2 95% Physical Exam  Constitutional: She is oriented to person, place, and time. She appears well-developed and well-nourished.  HENT:  Head: Normocephalic and atraumatic.  Right Ear: Hearing, tympanic membrane, external ear and ear canal normal.  Left Ear: Hearing, tympanic membrane, external ear and ear canal normal.  Nose: Nose normal.  Mouth/Throat: Uvula is midline, oropharynx is clear and moist and mucous membranes are normal. No oral lesions. No trismus in the jaw. No uvula swelling.  Eyes: Conjunctivae are normal.  Neck: Normal range of motion. Neck supple.  Cardiovascular: Normal rate, regular rhythm and normal heart sounds.   Pulmonary/Chest: Effort normal and breath sounds normal. No stridor. No respiratory distress. She has no wheezes.  Abdominal: Soft. Bowel sounds are normal. She exhibits no distension. There is no tenderness.  Musculoskeletal: Normal range of motion.  Neurological: She is alert and oriented to person, place, and time.  Skin: Skin is warm and dry. Rash noted.  Small hives on face and anterior neck and upper chest. Few small scattered lesions on upper arms  Psychiatric: She has a normal mood and affect. Her behavior is normal.  Nursing note and vitals reviewed.   ED Course  Procedures (including critical care time) Labs Review Labs Reviewed - No data to display  Imaging Review No results found.   MDM   1. Urticaria   Patient given 60mg  dose of prednisone, 20 mg dose of pepcid and 25 mg IM dose of benadryl while at clinic and remained in clinic for observation. Significant improvement following above treatment. Will discharge home with Rx for epi pen and atarax. Please contact your doctor for follow up. If symptoms become suddenly worse or severe or you  experience swelling of lips, tongue or throat or you have difficulty breathing, speaking or swallowing, seek immediate treatment at your nearest emergency room. I would encourage you to discuss referral to an allergist for skin testing. Depending upon your results, you may be a candidate for immunotherapy (allergy shots).     Lutricia Feil, [place] 01/15/15 2138

## 2015-01-15 NOTE — ED Notes (Signed)
C/o 1 month duration of hive like reaction every day at 4 PM. Benadryl helps for a couple of hours

## 2015-01-15 NOTE — Discharge Instructions (Signed)
Please contact your doctor for follow up. If symptoms become suddenly worse or severe or you experience swelling of lips, tongue or throat or you have difficulty breathing, speaking or swallowing, seek immediate treatment at your nearest emergency room. I would encourage you to discuss referral to an allergist for skin testing. Depending upon your results, you may be a candidate for immunotherapy (allergy shots).  Allergies Allergies may happen from anything your body is sensitive to. This may be food, medicines, pollens, chemicals, and nearly anything around you in everyday life that produces allergens. An allergen is anything that causes an allergy producing substance. Heredity is often a factor in causing these problems. This means you may have some of the same allergies as your parents. Food allergies happen in all age groups. Food allergies are some of the most severe and life threatening. Some common food allergies are cow's milk, seafood, eggs, nuts, wheat, and soybeans. SYMPTOMS   Swelling around the mouth.  An itchy red rash or hives.  Vomiting or diarrhea.  Difficulty breathing. SEVERE ALLERGIC REACTIONS ARE LIFE-THREATENING. This reaction is called anaphylaxis. It can cause the mouth and throat to swell and cause difficulty with breathing and swallowing. In severe reactions only a trace amount of food (for example, peanut oil in a salad) may cause death within seconds. Seasonal allergies occur in all age groups. These are seasonal because they usually occur during the same season every year. They may be a reaction to molds, grass pollens, or tree pollens. Other causes of problems are house dust mite allergens, pet dander, and mold spores. The symptoms often consist of nasal congestion, a runny itchy nose associated with sneezing, and tearing itchy eyes. There is often an associated itching of the mouth and ears. The problems happen when you come in contact with pollens and other allergens.  Allergens are the particles in the air that the body reacts to with an allergic reaction. This causes you to release allergic antibodies. Through a chain of events, these eventually cause you to release histamine into the blood stream. Although it is meant to be protective to the body, it is this release that causes your discomfort. This is why you were given anti-histamines to feel better. If you are unable to pinpoint the offending allergen, it may be determined by skin or blood testing. Allergies cannot be cured but can be controlled with medicine. Hay fever is a collection of all or some of the seasonal allergy problems. It may often be treated with simple over-the-counter medicine such as diphenhydramine. Take medicine as directed. Do not drink alcohol or drive while taking this medicine. Check with your caregiver or package insert for child dosages. If these medicines are not effective, there are many new medicines your caregiver can prescribe. Stronger medicine such as nasal spray, eye drops, and corticosteroids may be used if the first things you try do not work well. Other treatments such as immunotherapy or desensitizing injections can be used if all else fails. Follow up with your caregiver if problems continue. These seasonal allergies are usually not life threatening. They are generally more of a nuisance that can often be handled using medicine. HOME CARE INSTRUCTIONS   If unsure what causes a reaction, keep a diary of foods eaten and symptoms that follow. Avoid foods that cause reactions.  If hives or rash are present:  Take medicine as directed.  You may use an over-the-counter antihistamine (diphenhydramine) for hives and itching as needed.  Apply cold compresses (cloths)  to the skin or take baths in cool water. Avoid hot baths or showers. Heat will make a rash and itching worse.  If you are severely allergic:  Following a treatment for a severe reaction, hospitalization is often  required for closer follow-up.  Wear a medic-alert bracelet or necklace stating the allergy.  You and your family must learn how to give adrenaline or use an anaphylaxis kit.  If you have had a severe reaction, always carry your anaphylaxis kit or EpiPen with you. Use this medicine as directed by your caregiver if a severe reaction is occurring. Failure to do so could have a fatal outcome. SEEK MEDICAL CARE IF:  You suspect a food allergy. Symptoms generally happen within 30 minutes of eating a food.  Your symptoms have not gone away within 2 days or are getting worse.  You develop new symptoms.  You want to retest yourself or your child with a food or drink you think causes an allergic reaction. Never do this if an anaphylactic reaction to that food or drink has happened before. Only do this under the care of a caregiver. SEEK IMMEDIATE MEDICAL CARE IF:   You have difficulty breathing, are wheezing, or have a tight feeling in your chest or throat.  You have a swollen mouth, or you have hives, swelling, or itching all over your body.  You have had a severe reaction that has responded to your anaphylaxis kit or an EpiPen. These reactions may return when the medicine has worn off. These reactions should be considered life threatening. MAKE SURE YOU:   Understand these instructions.  Will watch your condition.  Will get help right away if you are not doing well or get worse. Document Released: 01/25/2003 Document Revised: 02/26/2013 Document Reviewed: 07/01/2008 [facility] Patient Information 2015 [place], [place]. This information is not intended to replace advice given to you by your health care provider. Make sure you discuss any questions you have with your health care provider.  Hives Hives are itchy, red, swollen areas of the skin. They can vary in size and location on your body. Hives can come and go for hours or several days (acute hives) or for several weeks (chronic hives).  Hives do not spread from person to person (noncontagious). They may get worse with scratching, exercise, and emotional stress. CAUSES   Allergic reaction to food, additives, or drugs.  Infections, including the common cold.  Illness, such as vasculitis, lupus, or thyroid disease.  Exposure to sunlight, heat, or cold.  Exercise.  Stress.  Contact with chemicals. SYMPTOMS   Red or white swollen patches on the skin. The patches may change size, shape, and location quickly and repeatedly.  Itching.  Swelling of the hands, feet, and face. This may occur if hives develop deeper in the skin. DIAGNOSIS  Your caregiver can usually tell what is wrong by performing a physical exam. Skin or blood tests may also be done to determine the cause of your hives. In some cases, the cause cannot be determined. TREATMENT  Mild cases usually get better with medicines such as antihistamines. Severe cases may require an emergency epinephrine injection. If the cause of your hives is known, treatment includes avoiding that trigger.  HOME CARE INSTRUCTIONS   Avoid causes that trigger your hives.  Take antihistamines as directed by your caregiver to reduce the severity of your hives. Non-sedating or low-sedating antihistamines are usually recommended. Do not drive while taking an antihistamine.  Take any other medicines prescribed for itching  as directed by your caregiver.  Wear loose-fitting clothing.  Keep all follow-up appointments as directed by your caregiver. SEEK MEDICAL CARE IF:   You have persistent or severe itching that is not relieved with medicine.  You have painful or swollen joints. SEEK IMMEDIATE MEDICAL CARE IF:   You have a fever.  Your tongue or lips are swollen.  You have trouble breathing or swallowing.  You feel tightness in the throat or chest.  You have abdominal pain. These problems may be the first sign of a life-threatening allergic reaction. Call your local  emergency services (911 in [place] MAKE SURE YOU:   Understand these instructions.  Will watch your condition.  Will get help right away if you are not doing well or get worse. Document Released: 11/01/2005 Document Revised: 11/06/2013 Document Reviewed: 01/25/2012 [facility] Patient Information 2015 [place], [place]. This information is not intended to replace advice given to you by your health care provider. Make sure you discuss any questions you have with your health care provider.

## 2015-08-26 ENCOUNTER — Ambulatory Visit (HOSPITAL_COMMUNITY)
Admission: RE | Admit: 2015-08-26 | Discharge: 2015-08-26 | Disposition: A | Discharge status: Home / Self Care | Payer: No Typology Code available for payment source | Source: Ambulatory Visit | Attending: Internal Medicine | Admitting: Internal Medicine

## 2015-08-26 ENCOUNTER — Other Ambulatory Visit (HOSPITAL_COMMUNITY): Payer: Self-pay | Admitting: Internal Medicine

## 2015-08-26 DIAGNOSIS — R059 Cough, unspecified: Secondary | ICD-10-CM

## 2015-08-26 DIAGNOSIS — F17200 Nicotine dependence, unspecified, uncomplicated: Secondary | ICD-10-CM | POA: Insufficient documentation

## 2015-08-26 DIAGNOSIS — R918 Other nonspecific abnormal finding of lung field: Secondary | ICD-10-CM | POA: Insufficient documentation

## 2015-08-26 DIAGNOSIS — J45909 Unspecified asthma, uncomplicated: Secondary | ICD-10-CM | POA: Insufficient documentation

## 2015-08-26 DIAGNOSIS — R05 Cough: Secondary | ICD-10-CM | POA: Insufficient documentation

## 2016-10-22 LAB — GLUCOSE, POCT (MANUAL RESULT ENTRY): POC Glucose: 105 mg/dl — AB (ref 70–99)

## 2021-11-30 ENCOUNTER — Ambulatory Visit (INDEPENDENT_AMBULATORY_CARE_PROVIDER_SITE_OTHER): Payer: Medicaid Other

## 2021-11-30 ENCOUNTER — Other Ambulatory Visit: Payer: Self-pay

## 2021-11-30 ENCOUNTER — Ambulatory Visit (INDEPENDENT_AMBULATORY_CARE_PROVIDER_SITE_OTHER): Payer: Medicaid Other | Admitting: Sports Medicine

## 2021-11-30 DIAGNOSIS — M5134 Other intervertebral disc degeneration, thoracic region: Secondary | ICD-10-CM | POA: Insufficient documentation

## 2021-11-30 DIAGNOSIS — M170 Bilateral primary osteoarthritis of knee: Secondary | ICD-10-CM

## 2021-11-30 DIAGNOSIS — M549 Dorsalgia, unspecified: Secondary | ICD-10-CM

## 2021-11-30 DIAGNOSIS — G8929 Other chronic pain: Secondary | ICD-10-CM

## 2021-11-30 DIAGNOSIS — M25511 Pain in right shoulder: Secondary | ICD-10-CM

## 2021-11-30 MED ORDER — CELECOXIB 200 MG PO CAPS: ORAL_CAPSULE | ORAL | 2 refills | Status: DC

## 2021-11-30 NOTE — Assessment & Plan Note (Signed)
Chronic mid back pain, patient endorses discomfort at the thoracolumbar junction, adding thoracic and lumbar spine x-rays, Celebrex, formal physical therapy.

## 2021-11-30 NOTE — Progress Notes (Signed)
° ° °  Procedures performed today:    None.  Independent interpretation of notes and tests performed by another provider:   None.  Brief History, Exam, Impression, and Recommendations:    Mid back pain, chronic Chronic mid back pain, patient endorses discomfort at the thoracolumbar junction, adding thoracic and lumbar spine x-rays, Celebrex, formal physical therapy.  Primary osteoarthritis of both knees Bilateral medial joint line pain, gelling, all classic symptoms of osteoarthritis. She had an injection almost 3 months ago that seem to work well, adding Celebrex, x-rays, formal PT, we did discuss some anticipatory guidance as well.  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 Return to see me in 6 weeks, injection if no better.  Chronic process with exacerbation and pharmacologic intervention  ___________________________________________ Gwen Her. Dianah Field, M.D., ABFM., CAQSM. Primary Care and Mutual Instructor of [employer] [facility] [employer]

## 2021-11-30 NOTE — Assessment & Plan Note (Signed)
Bilateral medial joint line pain, gelling, all classic symptoms of osteoarthritis. She had an injection almost 3 months ago that seem to work well, adding Celebrex, x-rays, formal PT, we did discuss some anticipatory guidance as well.

## 2021-11-30 NOTE — Assessment & Plan Note (Signed)
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 Return to see me in 6 weeks, injection if no better.

## 2021-12-16 ENCOUNTER — Encounter: Payer: Self-pay | Admitting: Rehabilitative and Restorative Service Providers"

## 2021-12-16 ENCOUNTER — Ambulatory Visit: Payer: Medicaid Other | Attending: Sports Medicine | Admitting: Rehabilitative and Restorative Service Providers"

## 2021-12-16 ENCOUNTER — Other Ambulatory Visit: Payer: Self-pay

## 2021-12-16 DIAGNOSIS — M25561 Pain in right knee: Secondary | ICD-10-CM | POA: Diagnosis not present

## 2021-12-16 DIAGNOSIS — M6281 Muscle weakness (generalized): Secondary | ICD-10-CM | POA: Insufficient documentation

## 2021-12-16 DIAGNOSIS — G8929 Other chronic pain: Secondary | ICD-10-CM | POA: Insufficient documentation

## 2021-12-16 DIAGNOSIS — Z419 Encounter for procedure for purposes other than remedying health state, unspecified: Secondary | ICD-10-CM | POA: Diagnosis not present

## 2021-12-16 DIAGNOSIS — M25562 Pain in left knee: Secondary | ICD-10-CM | POA: Insufficient documentation

## 2021-12-16 DIAGNOSIS — M549 Dorsalgia, unspecified: Secondary | ICD-10-CM

## 2021-12-16 DIAGNOSIS — M5489 Other dorsalgia: Secondary | ICD-10-CM | POA: Diagnosis present

## 2021-12-16 DIAGNOSIS — R29898 Other symptoms and signs involving the musculoskeletal system: Secondary | ICD-10-CM | POA: Insufficient documentation

## 2021-12-16 NOTE — Patient Instructions (Signed)
Access Code: [email or web address] URL: [email or web address] Date: 12/16/2021 Prepared by: Gillermo Murdoch  Exercises Prone Quadriceps Stretch with Strap - 2 x daily - 7 x weekly - 1 sets - 3 reps - 30 sec hold Prone Gluteal Sets - 2 x daily - 7 x weekly - 1 sets - 10 reps - 10 sec hold Hooklying Hamstring Stretch with Strap - 2 x daily - 7 x weekly - 1 sets - 3 reps - 30 sec hold Supine Quad Set - 2 x daily - 7 x weekly - 1 sets - 10 reps - 3 sec hold Small Range Straight Leg Raise - 2 x daily - 7 x weekly - 1 sets - 10 reps - 5 sec hold

## 2021-12-16 NOTE — Therapy (Addendum)
[place], [name], [address] Phone: [phone]   Fax:  [phone]  Physical Therapy Evaluation  Patient Details  Name: Casey Powell MRN: [id number] Date of Birth: 1956-06-29 Referring Provider (PT): Dr Dianah Field   Encounter Date: 12/16/2021   PT End of Session - 12/16/21 1245     Visit Number 1    Number of Visits 12    Date for PT Re-Evaluation 01/27/22    PT Start Time 1018    PT Stop Time 1104    PT Time Calculation (min) 46 min    Activity Tolerance Patient tolerated treatment well             Past Medical History:  Diagnosis Date   Asthma    GERD (gastroesophageal reflux disease)    Hyperlipidemia    Hypertension     Past Surgical History:  Procedure Laterality Date   ABDOMINAL HYSTERECTOMY     TONSILLECTOMY      There were no vitals filed for this visit.    Subjective Assessment - 12/16/21 1027     Subjective Patient reports that she has had pain in both knees for the past 2 years with pain progressively worsening. She had an injection in the Rt knee. She does not feel secure on her knees, squat, bend, going up and down stairs.    Pertinent History OA; DDD lumbar spine; COPD; AODM; HTN; chronic back pain x 30 yrs    Patient Stated Goals get up and down stairs    Currently in Pain? Yes    Pain Score 4     Pain Location Knee    Pain Orientation Right;Left    Pain Descriptors / Indicators Aching;Sharp;Shooting    Pain Type Chronic pain    Pain Onset More than a month ago    Pain Frequency Intermittent    Aggravating Factors  squat; stair; bending knees; standing; walking    Pain Relieving Factors propping leg up                [facility] PT Assessment - 12/16/21 0001       Assessment   Medical Diagnosis OA bilat knees; thoracic pain    Referring Provider (PT) Dr Dianah Field    Onset Date/Surgical Date 11/16/19    Hand Dominance Right    Next MD Visit  01/11/22    Prior Therapy none      Precautions   Precautions None      Restrictions   Weight Bearing Restrictions No      Balance Screen   Has the patient fallen in the past 6 months No    Has the patient had a decrease in activity level because of a fear of falling?  No    Is the patient reluctant to leave their home because of a fear of falling?  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 [place]    Leisure household chores; some cooking; sedentary; helps with grandchildren      Observation/Other Assessments  Focus on Therapeutic Outcomes (FOTO)  27      AROM   Overall AROM Comments decreased Rt > Lt hip extension and rotation    Right Knee Extension -8    Right Knee Flexion 108    Left Knee Extension -5    Left Knee Flexion 110      Strength   Right Hip Flexion 4-/5    Right Hip Extension 3-/5    Right Hip ABduction 4/5    Left Hip Flexion 4/5    Left Hip Extension 3/5    Left Hip ABduction 4+/5    Right Knee Flexion 4/5    Right Knee Extension 4+/5    Left Knee Flexion 4/5    Left Knee Extension 4+/5      Flexibility   Hamstrings tight Rt > Lt    Quadriceps tight Rt 85 deg; Lt 90 deg    ITB tight Rt > Lt    Piriformis tight Rt > Lt      Palpation   Patella mobility decreased Rt      Ambulation/Gait   Gait Comments anormal gait pattern with limp Rt LE - decreased wt bearing Rt in stance phase; flexed forward hips                        Objective measurements completed on examination: See above findings.       [facility] Adult PT Treatment/Exercise - 12/16/21 0001       Knee/Hip Exercises: Stretches    Passive Hamstring Stretch Right;Left;2 reps;30 seconds    Passive Hamstring Stretch Limitations supine with strap    Quad Stretch Right;Left;2 reps;30 seconds    Quad Stretch Limitations prone with strap      Knee/Hip Exercises: Supine   Quad Sets Strengthening;Right;Left;5 reps   5-10 sec hold   Straight Leg Raises Strengthening;Right;Left;5 reps   3-5 reps     Knee/Hip Exercises: Prone   Other Prone Exercises glut set 10 sec x 5 reps                     PT Education - 12/16/21 1057     Education Details POC HEP    Person(s) Educated Patient;Child(ren)    Methods Explanation;Demonstration;Tactile cues;Verbal cues;Handout    Comprehension Verbalized understanding;Returned demonstration;Verbal cues required;Tactile cues required                 PT Long Term Goals - 12/16/21 1253       PT LONG TERM GOAL #1   Title Decrease pain in bilat knees by 25-50% allowing patient to be more active in home    Time 6    Period Weeks    Status New    Target Date 01/27/22      PT LONG TERM GOAL #2   Title Increase strength to 4/5 t0 5/5 bilat LE's    Time 6    Period Weeks    Status New    Target Date 01/27/22      PT LONG TERM GOAL #3   Title Patient reports ability to ascend and descend 13 steps in home with greater stability and less pain to safely move to and from bedroom to living area in home    Time 6    Period Weeks    Status New    Target Date 01/27/22      PT LONG TERM GOAL #4   Title Independent in HEP (including aquatic  program as indicated)    Time 6    Period Weeks    Status New    Target Date 01/27/22      PT LONG TERM GOAL #5   Title Improve functional limitation score to 36    Time 6    Period Weeks    Status New    Target Date 01/27/22                    Plan - 12/16/21 1246     Clinical Impression Statement Patient presents with ~ 2 year history of progressively worsening bilat knee pain Rt > Lt. She has no known injury.  Diagnosis of OA bilat knees. She received injection in the Rt knee ~ 3 months ago. Patient has pain with functional activities including transfers and transitional movement; standing; walking; stairs; lifting; squatting. She has poor posture and alignment; limited LE mobility and ROM; LE weakness; pain. Patient will benefit from PT to address problems identified.    Personal Factors and Comorbidities Age;Fitness;Comorbidity 1;Comorbidity 2;Comorbidity 3+    Comorbidities AODM; HTN; Obesity; chronic LBP    Examination-Activity Limitations Locomotion Level;Transfers;Bend;Squat;Stairs;Stand    Stability/Clinical Decision Making Stable/Uncomplicated    Clinical Decision Making Low    Rehab Potential Good    PT Frequency 2x / week    PT Duration 6 weeks    PT Treatment/Interventions ADLs/Self Care Home Management;Aquatic Therapy;Cryotherapy;Electrical Stimulation;Iontophoresis 4mg /ml Dexamethasone;Moist Heat;Ultrasound;Gait training;Stair training;Functional mobility training;Therapeutic activities;Therapeutic exercise;Balance training;Neuromuscular re-education;Patient/family education;Manual techniques;Passive range of motion;Dry needling;Taping    PT Next Visit Plan evaluation for back pain; review and progress LE stretching/strengthening; progressing with functional strengthening, gait training, stairs, etc as indicated; modalities as indicated. May benefit from TENS to help manage pain in knees and back    PT Home Exercise Plan [email or web address]    Consulted and Agree with Plan of Care Patient;Family member/caregiver    Family Member Consulted daughter Leylany Nored             Patient will benefit from skilled therapeutic intervention in order to improve the following deficits and impairments:  Abnormal gait, Decreased range of motion, Decreased activity tolerance, Pain, Impaired flexibility, Improper body mechanics, Decreased mobility, Decreased strength, Postural dysfunction  Visit Diagnosis: Chronic  pain of right knee  Chronic pain of left knee  Mid back pain, chronic  Other symptoms and signs involving the musculoskeletal system  Muscle weakness (generalized)     Problem List Patient Active Problem List   Diagnosis Date Noted   Primary osteoarthritis of both knees 11/30/2021   Chronic right shoulder pain 11/30/2021   Mid back pain, chronic 11/30/2021    Alexys Lobello Nilda Simmer, PT, MPH  12/16/2021, 12:58 PM  [facility] [place], [name], [address] Phone: [phone]   Fax:  [phone]  Name: Avleen Bordwell MRN: [id number] Date of Birth: 1955-11-20

## 2021-12-23 ENCOUNTER — Ambulatory Visit: Payer: Medicaid Other | Admitting: Physical Therapy

## 2021-12-25 ENCOUNTER — Ambulatory Visit: Payer: Medicaid Other | Admitting: Physical Therapy

## 2021-12-25 ENCOUNTER — Other Ambulatory Visit: Payer: Self-pay

## 2021-12-25 DIAGNOSIS — M5489 Other dorsalgia: Secondary | ICD-10-CM | POA: Diagnosis not present

## 2021-12-25 DIAGNOSIS — M6281 Muscle weakness (generalized): Secondary | ICD-10-CM

## 2021-12-25 DIAGNOSIS — M25562 Pain in left knee: Secondary | ICD-10-CM | POA: Diagnosis not present

## 2021-12-25 DIAGNOSIS — M549 Dorsalgia, unspecified: Secondary | ICD-10-CM

## 2021-12-25 DIAGNOSIS — R29898 Other symptoms and signs involving the musculoskeletal system: Secondary | ICD-10-CM | POA: Diagnosis not present

## 2021-12-25 DIAGNOSIS — M25561 Pain in right knee: Secondary | ICD-10-CM | POA: Diagnosis not present

## 2021-12-25 DIAGNOSIS — G8929 Other chronic pain: Secondary | ICD-10-CM | POA: Diagnosis not present

## 2021-12-25 NOTE — Patient Instructions (Signed)
Access Code: [email or web address] URL: [email or web address] Date: 12/25/2021 Prepared by: Isabelle Course  Exercises Prone Quadriceps Stretch with Strap - 2 x daily - 7 x weekly - 1 sets - 3 reps - 30 sec hold Hooklying Hamstring Stretch with Strap - 2 x daily - 7 x weekly - 1 sets - 3 reps - 30 sec hold Small Range Straight Leg Raise - 2 x daily - 7 x weekly - 2 sets - 10 reps - 5 sec hold Supine Bridge - 1 x daily - 7 x weekly - 2 sets - 10 reps Hooklying Clamshell with Resistance - 1 x daily - 7 x weekly - 2 sets - 10 reps Standing Bilateral Low Shoulder Row with Anchored Resistance - 1 x daily - 7 x weekly - 2 sets - 10 reps Shoulder Extension with Resistance - 1 x daily - 7 x weekly - 2 sets - 10 reps Seated Thoracic Flexion and Rotation with Arms Crossed - 1 x daily - 7 x weekly - 1 sets - 10 reps - 10 seconds hold

## 2021-12-25 NOTE — Therapy (Signed)
[place], [name], [address] Phone: [phone]   Fax:  [phone]  Physical Therapy Treatment  Patient Details  Name: Casey Powell MRN: [id number] Date of Birth: [birth year] Referring Provider (PT): Dr Dianah Field   Encounter Date: 12/25/2021   PT End of Session - 12/25/21 1046     Visit Number 2    Number of Visits 12    Date for PT Re-Evaluation 01/27/22    PT Start Time 1010    PT Stop Time 1050    PT Time Calculation (min) 40 min    Activity Tolerance Patient tolerated treatment well    Behavior During Therapy [facility] for tasks assessed/performed             Past Medical History:  Diagnosis Date   Asthma    GERD (gastroesophageal reflux disease)    Hyperlipidemia    Hypertension     Past Surgical History:  Procedure Laterality Date   ABDOMINAL HYSTERECTOMY     TONSILLECTOMY      There were no vitals filed for this visit.   Subjective Assessment - 12/25/21 1010     Subjective Pt state she feels "about the same". She states her back has still been hurting her    Patient is accompained by: Family member    Patient Stated Goals get up and down stairs    Currently in Pain? Yes    Pain Score 4     Pain Location Back    Pain Orientation Lower    Pain Type Chronic pain                [facility] PT Assessment - 12/25/21 0001       Assessment   Medical Diagnosis OA bilat knees; thoracic pain    Referring Provider (PT) Dr Dianah Field    Onset Date/Surgical Date 11/16/19    Hand Dominance Right    Next MD Visit 01/11/22    Prior Therapy none      AROM   Overall AROM Comments decreased thoracic rotation Rt > Lt                           [facility] Adult PT Treatment/Exercise - 12/25/21 0001       Exercises   Exercises Lumbar      Lumbar Exercises: Stretches   Other Lumbar Stretch Exercise seated thoracic rotation 7 x 10 sec hold bilat      Lumbar  Exercises: Standing   Row 10 reps    Theraband Level (Row) Level 2 (Red)    Shoulder Extension 10 reps    Theraband Level (Shoulder Extension) Level 2 (Red)      Knee/Hip Exercises: Stretches   Passive Hamstring Stretch Right;Left;2 reps    Passive Hamstring Stretch Limitations supine      Knee/Hip Exercises: Aerobic   Nustep L5 x 5 min for warm up      Knee/Hip Exercises: Supine   Bridges 10 reps    Straight Leg Raises Right;Left;10 reps      Modalities   Modalities Electrical Stimulation;Moist Heat      Moist Heat Therapy   Number Minutes Moist Heat 10 Minutes    Moist Heat Location Lumbar Spine   thoracic     Electrical Stimulation   Electrical Stimulation Location thoracic    Electrical Stimulation Action TENS    Electrical Stimulation Parameters to tolerance    Electrical Stimulation Goals  Pain                     PT Education - 12/25/21 1045     Education Details updated HEP    Person(s) Educated Patient;Child(ren)    Methods Demonstration;Explanation;Handout    Comprehension Returned demonstration;Verbalized understanding                 PT Long Term Goals - 12/16/21 1253       PT LONG TERM GOAL #1   Title Decrease pain in bilat knees by 25-50% allowing patient to be more active in home    Time 6    Period Weeks    Status New    Target Date 01/27/22      PT LONG TERM GOAL #2   Title Increase strength to 4/5 t0 5/5 bilat LE's    Time 6    Period Weeks    Status New    Target Date 01/27/22      PT LONG TERM GOAL #3   Title Patient reports ability to ascend and descend 13 steps in home with greater stability and less pain to safely move to and from bedroom to living area in home    Time 6    Period Weeks    Status New    Target Date 01/27/22      PT LONG TERM GOAL #4   Title Independent in HEP (including aquatic program as indicated)    Time 6    Period Weeks    Status New    Target Date 01/27/22      PT LONG TERM GOAL #5    Title Improve functional limitation score to 36    Time 6    Period Weeks    Status New    Target Date 01/27/22                   Plan - 12/25/21 1046     Clinical Impression Statement PT evaluated pt's thoracic spine. Pt with c/o pain with prolonged standing and walking. Eases with supine rest. PT added thoracic strength and mobility to HEP. Good tolerance to additional exercises today. Trial of TENS to decrease back pain    PT Next Visit Plan progress core and knee strength and mobiltiy as tolerated functional strength. TENS as needed    PT Home Exercise Plan [email or web address]    Consulted and Agree with Plan of Care Patient;Family member/caregiver    Family Member Consulted daughter Casey Powell             Patient will benefit from skilled therapeutic intervention in order to improve the following deficits and impairments:     Visit Diagnosis: Mid back pain, chronic  Chronic pain of left knee  Chronic pain of right knee  Muscle weakness (generalized)  Other symptoms and signs involving the musculoskeletal system     Problem List Patient Active Problem List   Diagnosis Date Noted   Primary osteoarthritis of both knees 11/30/2021   Chronic right shoulder pain 11/30/2021   Mid back pain, chronic 11/30/2021    Casey Powell, PT 12/25/2021, 10:48 AM  [facility] [place], [name], [address] Phone: [phone]   Fax:  [phone]  Name: Casey Powell MRN: [id number] Date of Birth: [birth year]

## 2021-12-30 ENCOUNTER — Other Ambulatory Visit: Payer: Self-pay

## 2021-12-30 ENCOUNTER — Ambulatory Visit: Payer: Medicaid Other | Admitting: Physical Therapy

## 2021-12-30 DIAGNOSIS — G8929 Other chronic pain: Secondary | ICD-10-CM | POA: Diagnosis not present

## 2021-12-30 DIAGNOSIS — R29898 Other symptoms and signs involving the musculoskeletal system: Secondary | ICD-10-CM

## 2021-12-30 DIAGNOSIS — M549 Dorsalgia, unspecified: Secondary | ICD-10-CM

## 2021-12-30 DIAGNOSIS — M25562 Pain in left knee: Secondary | ICD-10-CM | POA: Diagnosis not present

## 2021-12-30 DIAGNOSIS — M6281 Muscle weakness (generalized): Secondary | ICD-10-CM | POA: Diagnosis not present

## 2021-12-30 DIAGNOSIS — M25561 Pain in right knee: Secondary | ICD-10-CM | POA: Diagnosis not present

## 2021-12-30 DIAGNOSIS — M5489 Other dorsalgia: Secondary | ICD-10-CM | POA: Diagnosis not present

## 2021-12-30 NOTE — Therapy (Signed)
[place], [name], [address] Phone: [phone]   Fax:  [phone]  Physical Therapy Treatment  Patient Details  Name: Casey Powell MRN: [id number] Date of Birth: 25-Nov-1955 Referring Provider (PT): Dr Dianah Field   Encounter Date: 12/30/2021   PT End of Session - 12/30/21 1057     Visit Number 3    Number of Visits 12    Date for PT Re-Evaluation 01/27/22    PT Start Time 8527    PT Stop Time 1056    PT Time Calculation (min) 41 min    Activity Tolerance Patient tolerated treatment well    Behavior During Therapy [facility] for tasks assessed/performed             Past Medical History:  Diagnosis Date   Asthma    GERD (gastroesophageal reflux disease)    Hyperlipidemia    Hypertension     Past Surgical History:  Procedure Laterality Date   ABDOMINAL HYSTERECTOMY     TONSILLECTOMY      There were no vitals filed for this visit.   Subjective Assessment - 12/30/21 1015     Subjective Pt continues to state she feels "the same". Her back still causes the most pain    Patient Stated Goals get up and down stairs    Currently in Pain? Yes    Pain Score 5     Pain Location Back    Pain Orientation Mid    Pain Descriptors / Indicators Aching                               [facility] Adult PT Treatment/Exercise - 12/30/21 0001       Lumbar Exercises: Stretches   Other Lumbar Stretch Exercise seated thoracic rotation 10 x 10 sec hold bilat    Other Lumbar Stretch Exercise physioball roll out forward and bilaterally 10 x 10 sec each, doorway stretch 2 x 20 sec      Lumbar Exercises: Standing   Row 20 reps    Theraband Level (Row) Level 2 (Red)    Shoulder Extension 20 reps    Theraband Level (Shoulder Extension) Level 2 (Red)      Knee/Hip Exercises: Stretches   Passive Hamstring Stretch Right;Left;2 reps;30 seconds    Passive Hamstring Stretch Limitations  supine      Knee/Hip Exercises: Aerobic   Nustep L5 x 5 min for warm up      Knee/Hip Exercises: Seated   Long Arc Quad 10 reps    Long Arc Quad Weight 2 lbs.    Long [employer] Limitations with 3 sec hold    [employer] 15 reps    Sit to [employer] 10 reps;without UE support   holding 2# weight     Knee/Hip Exercises: Supine   Bridges 15 reps    Straight Leg Raises Right;Left;2 sets;10 reps    Other Supine Knee/Hip Exercises supine clam red TB x 20                          PT Long Term Goals - 12/16/21 1253       PT LONG TERM GOAL #1   Title Decrease pain in bilat knees by 25-50% allowing patient to be more active in home    Time 6    Period Weeks    Status New    Target  Date 01/27/22      PT LONG TERM GOAL #2   Title Increase strength to 4/5 t0 5/5 bilat LE's    Time 6    Period Weeks    Status New    Target Date 01/27/22      PT LONG TERM GOAL #3   Title Patient reports ability to ascend and descend 13 steps in home with greater stability and less pain to safely move to and from bedroom to living area in home    Time 6    Period Weeks    Status New    Target Date 01/27/22      PT LONG TERM GOAL #4   Title Independent in HEP (including aquatic program as indicated)    Time 6    Period Weeks    Status New    Target Date 01/27/22      PT LONG TERM GOAL #5   Title Improve functional limitation score to 36    Time 6    Period Weeks    Status New    Target Date 01/27/22                   Plan - 12/30/21 1058     Clinical Impression Statement Pt states no effects from TENs or heat so treatment session focused on progressing core and knee strength and endurance. Pt states she feels "more pleasant" after treatment    PT Next Visit Plan progress core and knee strength and mobiltiy as tolerated functional strength    PT Home Exercise Plan [email or web address]    Consulted and Agree with Plan of Care Patient;Family member/caregiver    Family Member  Consulted daughter Leilani Cespedes             Patient will benefit from skilled therapeutic intervention in order to improve the following deficits and impairments:     Visit Diagnosis: Mid back pain, chronic  Chronic pain of left knee  Chronic pain of right knee  Muscle weakness (generalized)  Other symptoms and signs involving the musculoskeletal system     Problem List Patient Active Problem List   Diagnosis Date Noted   Primary osteoarthritis of both knees 11/30/2021   Chronic right shoulder pain 11/30/2021   Mid back pain, chronic 11/30/2021    Assad Harbeson, PT 12/30/2021, 10:59 AM  [facility] [place], [name], [address] Phone: [phone]   Fax:  [phone]  Name: Casey Powell MRN: [id number] Date of Birth: [birth year]

## 2022-01-01 ENCOUNTER — Ambulatory Visit: Payer: Medicaid Other | Admitting: Rehabilitative and Restorative Service Providers"

## 2022-01-06 ENCOUNTER — Other Ambulatory Visit: Payer: Self-pay

## 2022-01-06 ENCOUNTER — Ambulatory Visit: Payer: Medicaid Other | Admitting: Physical Therapy

## 2022-01-06 DIAGNOSIS — M6281 Muscle weakness (generalized): Secondary | ICD-10-CM | POA: Diagnosis not present

## 2022-01-06 DIAGNOSIS — M549 Dorsalgia, unspecified: Secondary | ICD-10-CM

## 2022-01-06 DIAGNOSIS — G8929 Other chronic pain: Secondary | ICD-10-CM | POA: Diagnosis not present

## 2022-01-06 DIAGNOSIS — M25562 Pain in left knee: Secondary | ICD-10-CM

## 2022-01-06 DIAGNOSIS — M5489 Other dorsalgia: Secondary | ICD-10-CM | POA: Diagnosis not present

## 2022-01-06 DIAGNOSIS — R29898 Other symptoms and signs involving the musculoskeletal system: Secondary | ICD-10-CM

## 2022-01-06 DIAGNOSIS — M25561 Pain in right knee: Secondary | ICD-10-CM | POA: Diagnosis not present

## 2022-01-06 NOTE — Therapy (Signed)
[place], [name], [address] Phone: [phone]   Fax:  [phone]  Physical Therapy Treatment  Patient Details  Name: Casey Powell MRN: [id number] Date of Birth: November 18, 1955 Referring Provider (PT): Dr Dianah Field   Encounter Date: 01/06/2022   PT End of Session - 01/06/22 1056     Visit Number 4    Number of Visits 12    Date for PT Re-Evaluation 01/27/22    PT Start Time 6962    PT Stop Time 1056    PT Time Calculation (min) 41 min    Activity Tolerance Patient tolerated treatment well    Behavior During Therapy [facility] for tasks assessed/performed             Past Medical History:  Diagnosis Date   Asthma    GERD (gastroesophageal reflux disease)    Hyperlipidemia    Hypertension     Past Surgical History:  Procedure Laterality Date   ABDOMINAL HYSTERECTOMY     TONSILLECTOMY      There were no vitals filed for this visit.   Subjective Assessment - 01/06/22 1018     Subjective Pt states she feels her pain goes up and down all the time. She feels her knees and back always hurt    Patient Stated Goals get up and down stairs    Currently in Pain? Yes    Pain Score 5     Pain Location Knee    Pain Orientation Right;Left    Pain Descriptors / Indicators Aching;Sore    Pain Type Chronic pain                               [facility] Adult PT Treatment/Exercise - 01/06/22 0001       Lumbar Exercises: Stretches   Other Lumbar Stretch Exercise physioball roll out 10x10 sec forward and bilat      Lumbar Exercises: Standing   Row 20 reps    Theraband Level (Row) Level 3 (Green)    Shoulder Extension 20 reps    Theraband Level (Shoulder Extension) Level 3 (Green)      Knee/Hip Exercises: Stretches   Passive Hamstring Stretch Right;Left;2 reps;30 seconds    Passive Hamstring Stretch Limitations supine      Knee/Hip Exercises: Aerobic   Nustep L5 x 5 min  for warm up      Knee/Hip Exercises: Standing   Forward Step Up Right;Left;2 sets;10 reps;Hand Hold: 2;Step Height: 4"      Knee/Hip Exercises: Seated   Long Arc Quad 10 reps    Long Arc Quad Weight 2 lbs.    Long [employer] Limitations with 3 sec hold    Sit to [employer] 10 reps;without UE support   holding 2# weight     Knee/Hip Exercises: Supine   Bridges 20 reps    Straight Leg Raises Right;Left;2 sets;10 reps    Other Supine Knee/Hip Exercises supine clam red TB x 20                          PT Long Term Goals - 12/16/21 1253       PT LONG TERM GOAL #1   Title Decrease pain in bilat knees by 25-50% allowing patient to be more active in home    Time 6    Period Weeks    Status New  Target Date 01/27/22      PT LONG TERM GOAL #2   Title Increase strength to 4/5 t0 5/5 bilat LE's    Time 6    Period Weeks    Status New    Target Date 01/27/22      PT LONG TERM GOAL #3   Title Patient reports ability to ascend and descend 13 steps in home with greater stability and less pain to safely move to and from bedroom to living area in home    Time 6    Period Weeks    Status New    Target Date 01/27/22      PT LONG TERM GOAL #4   Title Independent in HEP (including aquatic program as indicated)    Time 6    Period Weeks    Status New    Target Date 01/27/22      PT LONG TERM GOAL #5   Title Improve functional limitation score to 36    Time 6    Period Weeks    Status New    Target Date 01/27/22                   Plan - 01/06/22 1057     Clinical Impression Statement Pt with good tolerance to step ups at 4'' step, will continue to progress standing knee strength.    PT Next Visit Plan lateral step ups 4'' step, standing knee strength    PT Home Exercise Plan [email or web address]    Consulted and Agree with Plan of Care Patient;Family member/caregiver    Family Member Consulted daughter Locklyn Henriquez             Patient will benefit from skilled  therapeutic intervention in order to improve the following deficits and impairments:     Visit Diagnosis: Mid back pain, chronic  Chronic pain of left knee  Chronic pain of right knee  Muscle weakness (generalized)  Other symptoms and signs involving the musculoskeletal system     Problem List Patient Active Problem List   Diagnosis Date Noted   Primary osteoarthritis of both knees 11/30/2021   Chronic right shoulder pain 11/30/2021   Mid back pain, chronic 11/30/2021    Kendrik Mcshan, PT 01/06/2022, 10:58 AM  [facility] [place], [name], [address] Phone: [phone]   Fax:  [phone]  Name: Joshlynn Alfonzo MRN: [id number] Date of Birth: Jul 03, 1956

## 2022-01-08 ENCOUNTER — Ambulatory Visit: Payer: Medicaid Other | Admitting: Rehabilitative and Restorative Service Providers"

## 2022-01-08 ENCOUNTER — Other Ambulatory Visit: Payer: Self-pay

## 2022-01-08 ENCOUNTER — Encounter: Payer: Self-pay | Admitting: Rehabilitative and Restorative Service Providers"

## 2022-01-08 DIAGNOSIS — M5489 Other dorsalgia: Secondary | ICD-10-CM | POA: Diagnosis not present

## 2022-01-08 DIAGNOSIS — M25561 Pain in right knee: Secondary | ICD-10-CM

## 2022-01-08 DIAGNOSIS — G8929 Other chronic pain: Secondary | ICD-10-CM

## 2022-01-08 DIAGNOSIS — M25562 Pain in left knee: Secondary | ICD-10-CM | POA: Diagnosis not present

## 2022-01-08 DIAGNOSIS — R29898 Other symptoms and signs involving the musculoskeletal system: Secondary | ICD-10-CM

## 2022-01-08 DIAGNOSIS — M6281 Muscle weakness (generalized): Secondary | ICD-10-CM

## 2022-01-08 NOTE — Patient Instructions (Signed)
Access Code: [email or web address] URL: [email or web address] Date: 01/08/2022 Prepared by: Gillermo Murdoch  Exercises Prone Quadriceps Stretch with Strap - 2 x daily - 7 x weekly - 1 sets - 3 reps - 30 sec hold Hooklying Hamstring Stretch with Strap - 2 x daily - 7 x weekly - 1 sets - 3 reps - 30 sec hold Small Range Straight Leg Raise - 2 x daily - 7 x weekly - 2 sets - 10 reps - 5 sec hold Supine Bridge - 1 x daily - 7 x weekly - 2 sets - 10 reps Hooklying Clamshell with Resistance - 1 x daily - 7 x weekly - 2 sets - 10 reps Standing Bilateral Low Shoulder Row with Anchored Resistance - 1 x daily - 7 x weekly - 2 sets - 10 reps Shoulder Extension with Resistance - 1 x daily - 7 x weekly - 2 sets - 10 reps Seated Thoracic Flexion and Rotation with Arms Crossed - 1 x daily - 7 x weekly - 1 sets - 10 reps - 10 seconds hold Standing Shoulder External Rotation with Resistance - 2 x daily - 7 x weekly - 1-3 sets - 10 reps - 2-3 sec hold Anti-Rotation Lateral Stepping with Press - 2 x daily - 7 x weekly - 1-2 sets - 10 reps - 2-3 sec hold Bridge with Hip Abduction and Resistance - 2 x daily - 7 x weekly - 1-2 sets - 10 reps - 3 sec hold Side Stepping with Resistance at Thighs - 1 x daily - 7 x weekly

## 2022-01-08 NOTE — Therapy (Signed)
[facility] [place], [name], [address] Phone: [phone]   Fax:  [phone]  Physical Therapy Treatment  Patient Details  Name: Casey Powell MRN: [id number] Date of Birth: Dec 28, 1955 Referring Provider (PT): Dr Dianah Field   Encounter Date: 01/08/2022   PT End of Session - 01/08/22 1016     Visit Number 5    Number of Visits 12    Date for PT Re-Evaluation 01/27/22    PT Start Time 4008    PT Stop Time 1100    PT Time Calculation (min) 45 min    Activity Tolerance Patient tolerated treatment well             Past Medical History:  Diagnosis Date   Asthma    GERD (gastroesophageal reflux disease)    Hyperlipidemia    Hypertension     Past Surgical History:  Procedure Laterality Date   ABDOMINAL HYSTERECTOMY     TONSILLECTOMY      There were no vitals filed for this visit.   Subjective Assessment - 01/08/22 1019     Subjective Patient reports that she felt muscle working yesterday. She is working on exercises "almost every day". Pain has not changed any.    Currently in Pain? Yes    Pain Score 4     Pain Location Leg    Pain Orientation Right;Left    Pain Descriptors / Indicators Aching;Sore    Pain Type Chronic pain    Pain Onset More than a month ago                [facility] PT Assessment - 01/08/22 0001       Assessment   Medical Diagnosis OA bilat knees; thoracic pain    Referring Provider (PT) Dr Dianah Field    Onset Date/Surgical Date 11/16/19    Hand Dominance Right    Next MD Visit 01/11/22    Prior Therapy none      AROM   Overall AROM Comments decreased thoracic rotation Rt > Lt    Right Knee Extension -5    Right Knee Flexion 110    Left Knee Extension -4    Left Knee Flexion 112      Strength   Right Hip Flexion 4/5    Right Hip Extension 4-/5    Right Hip ABduction 4+/5    Left Hip Flexion 4/5    Left Hip Extension 4/5    Left Hip ABduction  4+/5    Right Knee Flexion 4+/5    Right Knee Extension 5/5    Left Knee Flexion 4+/5    Left Knee Extension 5/5      Flexibility   Hamstrings tight Rt > Lt    Quadriceps tight Rt 85 deg; Lt 90 deg    ITB tight Rt > Lt    Piriformis tight Rt > Lt                           [facility] Adult PT Treatment/Exercise - 01/08/22 0001       Lumbar Exercises: Standing   Row 20 reps    Theraband Level (Row) Level 4 (Blue)    Shoulder Extension 20 reps    Theraband Level (Shoulder Extension) Level 4 (Blue)    Other Standing Lumbar Exercises scapular retraction green TB x 20 reps    Other Standing Lumbar Exercises antirotation green TB x 10 each side  Knee/Hip Exercises: Stretches   Passive Hamstring Stretch Right;Left;2 reps;30 seconds    Passive Hamstring Stretch Limitations supine      Knee/Hip Exercises: Aerobic   Nustep L5 x 6 min for warm up      Knee/Hip Exercises: Standing   Other Standing Knee Exercises side steps green TB above knees x 8 ft x 5 reps each side      Knee/Hip Exercises: Seated   Sit to Sand 10 reps;without UE support      Knee/Hip Exercises: Supine   Bridges with Clamshell Strengthening;Both;15 reps   green TB proximal to knees                    PT Education - 01/08/22 1049     Education Details HEP    Person(s) Educated Patient    Methods Explanation;Demonstration;Tactile cues;Verbal cues;Handout    Comprehension Verbalized understanding;Returned demonstration;Verbal cues required;Tactile cues required                 PT Long Term Goals - 12/16/21 1253       PT LONG TERM GOAL #1   Title Decrease pain in bilat knees by 25-50% allowing patient to be more active in home    Time 6    Period Weeks    Status New    Target Date 01/27/22      PT LONG TERM GOAL #2   Title Increase strength to 4/5 t0 5/5 bilat LE's    Time 6    Period Weeks    Status New    Target Date 01/27/22      PT LONG TERM GOAL #3   Title  Patient reports ability to ascend and descend 13 steps in home with greater stability and less pain to safely move to and from bedroom to living area in home    Time 6    Period Weeks    Status New    Target Date 01/27/22      PT LONG TERM GOAL #4   Title Independent in HEP (including aquatic program as indicated)    Time 6    Period Weeks    Status New    Target Date 01/27/22      PT LONG TERM GOAL #5   Title Improve functional limitation score to 36    Time 6    Period Weeks    Status New    Target Date 01/27/22                   Plan - 01/08/22 1030     Clinical Impression Statement Continued pain which is unchanged. Patient does report that she feels her muscles working with her exercises. Progressed strengthening for LE's and core/mid back. Patient demonstrates increasing strength bilat LE's. She will benefit from continued treatment to improve function. Note to MD today.    Rehab Potential Good    PT Frequency 2x / week    PT Duration 6 weeks    PT Treatment/Interventions ADLs/Self Care Home Management;Aquatic Therapy;Cryotherapy;Electrical Stimulation;Iontophoresis 4mg /ml Dexamethasone;Moist Heat;Ultrasound;Gait training;Stair training;Functional mobility training;Therapeutic activities;Therapeutic exercise;Balance training;Neuromuscular re-education;Patient/family education;Manual techniques;Passive range of motion;Dry needling;Taping    PT Next Visit Plan progress with LE and mid back strengthening as tolerated    PT Home Exercise Plan [id number]    Consulted and Agree with Plan of Care Patient;Family member/caregiver    Family Member Consulted daughter Meili Kleckley             Patient will  benefit from skilled therapeutic intervention in order to improve the following deficits and impairments:     Visit Diagnosis: Mid back pain, chronic  Chronic pain of left knee  Chronic pain of right knee  Muscle weakness (generalized)  Other symptoms and signs  involving the musculoskeletal system     Problem List Patient Active Problem List   Diagnosis Date Noted   Primary osteoarthritis of both knees 11/30/2021   Chronic right shoulder pain 11/30/2021   Mid back pain, chronic 11/30/2021    Aayat Hajjar Nilda Simmer, PT, MPH 01/08/2022, 11:03 AM  [facility] [place], [name], [address] Phone: [phone]   Fax:  [phone]  Name: Betti Goodenow MRN: [id number] Date of Birth: January 11, 1956

## 2022-01-11 ENCOUNTER — Other Ambulatory Visit: Payer: Self-pay

## 2022-01-11 ENCOUNTER — Ambulatory Visit (INDEPENDENT_AMBULATORY_CARE_PROVIDER_SITE_OTHER): Payer: Medicaid Other

## 2022-01-11 ENCOUNTER — Ambulatory Visit (INDEPENDENT_AMBULATORY_CARE_PROVIDER_SITE_OTHER): Payer: Medicaid Other | Admitting: Sports Medicine

## 2022-01-11 DIAGNOSIS — G8929 Other chronic pain: Secondary | ICD-10-CM | POA: Diagnosis not present

## 2022-01-11 DIAGNOSIS — M25511 Pain in right shoulder: Secondary | ICD-10-CM

## 2022-01-11 DIAGNOSIS — M549 Dorsalgia, unspecified: Secondary | ICD-10-CM | POA: Diagnosis not present

## 2022-01-11 DIAGNOSIS — M542 Cervicalgia: Secondary | ICD-10-CM | POA: Diagnosis not present

## 2022-01-11 DIAGNOSIS — M5030 Other cervical disc degeneration, unspecified cervical region: Secondary | ICD-10-CM | POA: Diagnosis not present

## 2022-01-11 DIAGNOSIS — M51369 Other intervertebral disc degeneration, lumbar region without mention of lumbar back pain or lower extremity pain: Secondary | ICD-10-CM

## 2022-01-11 DIAGNOSIS — M5136 Other intervertebral disc degeneration, lumbar region: Secondary | ICD-10-CM

## 2022-01-11 DIAGNOSIS — M5134 Other intervertebral disc degeneration, thoracic region: Secondary | ICD-10-CM

## 2022-01-11 MED ORDER — HYDROCODONE-ACETAMINOPHEN 10-325 MG PO TABS: 1.0000 | ORAL_TABLET | Freq: Three times a day (TID) | ORAL | 0 refills | Status: DC | PRN

## 2022-01-11 NOTE — Assessment & Plan Note (Addendum)
Persistent right shoulder pain, no impingement signs today, she does have a positive O'Brien's and positive crank test concerning more for a glenohumeral pain generator, considering failure of physical therapy, NSAIDs we proceeded today with a glenohumeral joint injection, return to see me in a month for this  For insurance purposes symptoms are not getting better with therapy.

## 2022-01-11 NOTE — Assessment & Plan Note (Addendum)
Casey Powell returns regarding her thoracic back pain, she has periscapular pain as well as pain in the upper lumbar spine. At this point she has failed NSAIDs, formal physical therapy for greater than 6 weeks including thoracic spine traction, isometrics, stretching, and stabilization and core strengthening. She did these exercises 3 times a week for 6 weeks and did not get better. X-rays unrevealing, proceeding with cervical, thoracic, and lumbar spine MRIs for epidural planning. Tramadol ineffective per her report, adding high-dose hydrocodone for pain relief in the meantime.

## 2022-01-11 NOTE — Progress Notes (Addendum)
° ° °  Procedures performed today:    Procedure: Real-time Ultrasound Guided injection of the right glenohumeral joint Device: Samsung HS60  Verbal informed consent obtained.  Time-out conducted.  Noted no overlying erythema, induration, or other signs of local infection.  Skin prepped in a sterile fashion.  Local anesthesia: Topical Ethyl chloride.  With sterile technique and under real time ultrasound guidance: Noted normal-appearing joint, 1 cc Kenalog 40, 2 cc lidocaine, 2 cc bupivacaine injected easily Completed without difficulty  Advised to call if fevers/chills, erythema, induration, drainage, or persistent bleeding.  Images permanently stored and available for review in PACS.  Impression: Technically successful ultrasound guided injection.  Independent interpretation of notes and tests performed by another provider:   None.  Brief History, Exam, Impression, and Recommendations:    Chronic right shoulder pain Persistent right shoulder pain, no impingement signs today, she does have a positive O'Brien's and positive crank test concerning more for a glenohumeral pain generator, considering failure of physical therapy, NSAIDs we proceeded today with a glenohumeral joint injection, return to see me in a month for this   For insurance purposes symptoms are not getting better with therapy.  DDD (degenerative disc disease), thoracic Heydy returns regarding her thoracic back pain, she has periscapular pain as well as pain in the upper lumbar spine. At this point she has failed NSAIDs, formal physical therapy for greater than 6 weeks including thoracic spine traction, isometrics, stretching, and stabilization and core strengthening. She did these exercises 3 times a week for 6 weeks and did not get better. X-rays unrevealing, proceeding with cervical, thoracic, and lumbar spine MRIs for epidural planning. Tramadol ineffective per her report, adding high-dose hydrocodone for pain relief  in the meantime.   DDD (degenerative disc disease), cervical [place] returns regarding her neck pain, she has periscapular pain as well as pain in the upper lumbar spine. At this point she has failed NSAIDs, formal physical therapy for greater than 6 weeks including cervical spine isometrics, stretching, traction.  She did these exercises 3 times a week for 6 weeks and did not get better. X-rays unrevealing, proceeding with cervical, thoracic, and lumbar spine MRIs for epidural planning. Tramadol ineffective per her report, adding high-dose hydrocodone for pain relief in the meantime.   For insurance purposes symptoms are not getting better with therapy.  DDD (degenerative disc disease), lumbar Jaimey returns regarding her lumbar pain, she has pain in the upper lumbar spine. At this point she has failed NSAIDs, formal physical therapy for greater than 6 weeks including back and core strengthening and stabilization. She did these exercises 3 times a week for 6 weeks and did not get better. X-rays unrevealing, proceeding with cervical, thoracic, and lumbar spine MRIs for epidural planning. Tramadol ineffective per her report, adding high-dose hydrocodone for pain relief in the meantime. For insurance purposes symptoms are not getting better with therapy.    ___________________________________________ Gwen Her. Dianah Field, M.D., ABFM., CAQSM. Primary Care and [place] Instructor of [employer] [facility] [employer]

## 2022-01-13 ENCOUNTER — Ambulatory Visit: Payer: Medicaid Other | Attending: Sports Medicine | Admitting: Physical Therapy

## 2022-01-13 ENCOUNTER — Other Ambulatory Visit: Payer: Self-pay

## 2022-01-13 DIAGNOSIS — M5136 Other intervertebral disc degeneration, lumbar region: Secondary | ICD-10-CM | POA: Insufficient documentation

## 2022-01-13 DIAGNOSIS — M549 Dorsalgia, unspecified: Secondary | ICD-10-CM | POA: Diagnosis present

## 2022-01-13 DIAGNOSIS — G8929 Other chronic pain: Secondary | ICD-10-CM | POA: Insufficient documentation

## 2022-01-13 DIAGNOSIS — M25562 Pain in left knee: Secondary | ICD-10-CM | POA: Diagnosis not present

## 2022-01-13 DIAGNOSIS — M25561 Pain in right knee: Secondary | ICD-10-CM | POA: Insufficient documentation

## 2022-01-13 DIAGNOSIS — M6281 Muscle weakness (generalized): Secondary | ICD-10-CM | POA: Insufficient documentation

## 2022-01-13 DIAGNOSIS — M5030 Other cervical disc degeneration, unspecified cervical region: Secondary | ICD-10-CM | POA: Insufficient documentation

## 2022-01-13 DIAGNOSIS — Z419 Encounter for procedure for purposes other than remedying health state, unspecified: Secondary | ICD-10-CM | POA: Diagnosis not present

## 2022-01-13 DIAGNOSIS — M51369 Other intervertebral disc degeneration, lumbar region without mention of lumbar back pain or lower extremity pain: Secondary | ICD-10-CM | POA: Insufficient documentation

## 2022-01-13 DIAGNOSIS — R29898 Other symptoms and signs involving the musculoskeletal system: Secondary | ICD-10-CM | POA: Insufficient documentation

## 2022-01-13 NOTE — Assessment & Plan Note (Addendum)
Casey Powell returns regarding her lumbar pain, she has pain in the upper lumbar spine. ?At this point she has failed NSAIDs, formal physical therapy for greater than 6 weeks including back and core strengthening and stabilization. ?She did these exercises 3 times a week for 6 weeks and did not get better. ?X-rays unrevealing, proceeding with cervical, thoracic, and lumbar spine MRIs for epidural planning. ?Tramadol ineffective per her report, adding high-dose hydrocodone for pain relief in the meantime. ?For insurance purposes symptoms are not getting better with therapy. ?

## 2022-01-13 NOTE — Assessment & Plan Note (Addendum)
Casey Powell returns regarding her neck pain, she has periscapular pain as well as pain in the upper lumbar spine. ?At this point she has failed NSAIDs, formal physical therapy for greater than 6 weeks including cervical spine isometrics, stretching, traction.  She did these exercises 3 times a week for 6 weeks and did not get better. ?X-rays unrevealing, proceeding with cervical, thoracic, and lumbar spine MRIs for epidural planning. ?Tramadol ineffective per her report, adding high-dose hydrocodone for pain relief in the meantime. ? ?For insurance purposes symptoms are not getting better with therapy. ?

## 2022-01-13 NOTE — Therapy (Signed)
[place] ?[facility] ?[place] ?[place], [name], [address] ?Phone: [phone]   Fax:  [phone] ? ?Physical Therapy Treatment ? ?Patient Details  ?Name: Casey Powell ?MRN: [id number] ?Date of Birth: 09/26/1956 ?Referring Provider (PT): Dr Dianah Field ? ? ?Encounter Date: 01/13/2022 ? ? PT End of Session - 01/13/22 1052   ? ? Visit Number 6   ? Number of Visits 12   ? Date for PT Re-Evaluation 01/27/22   ? PT Start Time 1010   ? PT Stop Time 1050   ? PT Time Calculation (min) 40 min   ? Activity Tolerance Patient tolerated treatment well   ? Behavior During Therapy [facility] for tasks assessed/performed   ? ?  ?  ? ?  ? ? ?Past Medical History:  ?Diagnosis Date  ? Asthma   ? GERD (gastroesophageal reflux disease)   ? Hyperlipidemia   ? Hypertension   ? ? ?Past Surgical History:  ?Procedure Laterality Date  ? ABDOMINAL HYSTERECTOMY    ? TONSILLECTOMY    ? ? ?There were no vitals filed for this visit. ? ? Subjective Assessment - 01/13/22 1012   ? ? Subjective Pt had an injection in her shoulder last week. She states that her shoulder feels "a little better". Biggest complaint today is knee pain   ? Patient Stated Goals get up and down stairs   ? Pain Score 6    ? Pain Location Knee   ? Pain Orientation Right   ? ?  ?  ? ?  ? ? ? ? ? [facility] PT Assessment - 01/13/22 0001   ? ?  ? Assessment  ? Medical Diagnosis OA bilat knees; thoracic pain   ? Referring Provider (PT) Dr Dianah Field   ? Onset Date/Surgical Date 11/16/19   ? Hand Dominance Right   ? Next MD Visit 01/11/22   ? Prior Therapy none   ? ?  ?  ? ?  ? ? ? ? ? ? ? ? ? ? ? ? ? ? ? ? [facility] Adult PT Treatment/Exercise - 01/13/22 0001   ? ?  ? Lumbar Exercises: Standing  ? Row 20 reps   ? Theraband Level (Row) Level 4 (Blue)   ? Shoulder Extension 20 reps   ? Theraband Level (Shoulder Extension) Level 4 (Blue)   ? Other Standing Lumbar Exercises antirotation green TB x 10 each side   ?  ? Knee/Hip Exercises:  Stretches  ? Passive Hamstring Stretch Right;Left;2 reps;30 seconds   ? Passive Hamstring Stretch Limitations supine   ?  ? Knee/Hip Exercises: Aerobic  ? Nustep L5 x 5 min for warm up   ?  ? Knee/Hip Exercises: Standing  ? Lateral Step Up Right;Left;10 reps   ? Forward Step Up Right;Left;2 sets;10 reps   ? Other Standing Knee Exercises side steps green TB above knees x 8 ft x 5 reps each side   ?  ? Knee/Hip Exercises: Seated  ? Sit to Sand 20 reps   holding 2#  ?  ? Knee/Hip Exercises: Supine  ? Bridges with Clamshell Strengthening;Both;20 reps   green TB proximal to knees  ? ?  ?  ? ?  ? ? ? ? ? ? ? ? ? ? ? ? ? ? ? PT Long Term Goals - 12/16/21 1253   ? ?  ? PT LONG TERM GOAL #1  ? Title Decrease pain in bilat knees by 25-50% allowing patient to be more active  in home   ? Time 6   ? Period Weeks   ? Status New   ? Target Date 01/27/22   ?  ? PT LONG TERM GOAL #2  ? Title Increase strength to 4/5 t0 5/5 bilat LE's   ? Time 6   ? Period Weeks   ? Status New   ? Target Date 01/27/22   ?  ? PT LONG TERM GOAL #3  ? Title Patient reports ability to ascend and descend 13 steps in home with greater stability and less pain to safely move to and from bedroom to living area in home   ? Time 6   ? Period Weeks   ? Status New   ? Target Date 01/27/22   ?  ? PT LONG TERM GOAL #4  ? Title Independent in [place] (including aquatic program as indicated)   ? Time 6   ? Period Weeks   ? Status New   ? Target Date 01/27/22   ?  ? PT LONG TERM GOAL #5  ? Title Improve functional limitation score to 36   ? Time 6   ? Period Weeks   ? Status New   ? Target Date 01/27/22   ? ?  ?  ? ?  ? ? ? ? ? ? ? ? Plan - 01/13/22 1052   ? ? Clinical Impression Statement Pt continues with pain but states she feels she can stand longer during cooking tasks before needing to sit. Pt with good tolerance to continued progression of core and LE exercises   ? PT Next Visit Plan progress with LE and mid back strengthening as tolerated   ? PT Home Exercise Plan  [id number]   ? Consulted and Agree with Plan of Care Patient;Family member/caregiver   ? Family Member Consulted daughter - Emely   ? ?  ?  ? ?  ? ? ?Patient will benefit from skilled therapeutic intervention in order to improve the following deficits and impairments:    ? ?Visit Diagnosis: ?Mid back pain, chronic ? ?Chronic pain of left knee ? ?Chronic pain of right knee ? ?Muscle weakness (generalized) ? ?Other symptoms and signs involving the musculoskeletal system ? ? ? ? ?Problem List ?Patient Active Problem List  ? Diagnosis Date Noted  ? Primary osteoarthritis of both knees 11/30/2021  ? Chronic right shoulder pain 11/30/2021  ? Mid back pain, chronic 11/30/2021  ? ? ?Kahlyn Shippey, PT ?01/13/2022, 10:55 AM ? ?[place] ?[facility] ?[address] ?[place], [name], [address] ?Phone: [phone]   Fax:  [phone] ? ?Name: Casey Powell ?MRN: [id number] ?Date of Birth: 1956/01/11 ? ? ? ?

## 2022-01-15 ENCOUNTER — Other Ambulatory Visit: Payer: Self-pay

## 2022-01-15 ENCOUNTER — Ambulatory Visit: Payer: Medicaid Other | Admitting: Physical Therapy

## 2022-01-15 DIAGNOSIS — R29898 Other symptoms and signs involving the musculoskeletal system: Secondary | ICD-10-CM | POA: Diagnosis not present

## 2022-01-15 DIAGNOSIS — M25562 Pain in left knee: Secondary | ICD-10-CM

## 2022-01-15 DIAGNOSIS — M549 Dorsalgia, unspecified: Secondary | ICD-10-CM

## 2022-01-15 DIAGNOSIS — G8929 Other chronic pain: Secondary | ICD-10-CM

## 2022-01-15 DIAGNOSIS — M6281 Muscle weakness (generalized): Secondary | ICD-10-CM | POA: Diagnosis not present

## 2022-01-15 DIAGNOSIS — M25561 Pain in right knee: Secondary | ICD-10-CM | POA: Diagnosis not present

## 2022-01-15 NOTE — Therapy (Signed)
[facility] ?[facility] ?[address] [address] ?[place], [name], [address] ?Phone: [phone]   Fax:  [phone] ? ?Physical Therapy Treatment ? ?Patient Details  ?Name: Casey Powell ?MRN: [id number] ?Date of Birth: [birth year] ?Referring Provider (PT): Dr Dianah Field ? ? ?Encounter Date: 01/15/2022 ? ? PT End of Session - 01/15/22 1357   ? ? Visit Number 7   ? Number of Visits 12   ? Date for PT Re-Evaluation 01/27/22   ? PT Start Time 1315   ? PT Stop Time 1356   ? PT Time Calculation (min) 41 min   ? Activity Tolerance Patient tolerated treatment well   ? Behavior During Therapy Casey Powell for tasks assessed/performed   ? ?  ?  ? ?  ? ? ?Past Medical History:  ?Diagnosis Date  ? Asthma   ? GERD (gastroesophageal reflux disease)   ? Hyperlipidemia   ? Hypertension   ? ? ?Past Surgical History:  ?Procedure Laterality Date  ? ABDOMINAL HYSTERECTOMY    ? TONSILLECTOMY    ? ? ?There were no vitals filed for this visit. ? ? Subjective Assessment - 01/15/22 1315   ? ? Subjective Pt states her knee is her main complaint today. "it really started hurting on Monday"   ? Patient Stated Goals get up and down stairs   ? Currently in Pain? Yes   ? Pain Score 6    ? Pain Location Knee   ? Pain Orientation Right   ? Pain Descriptors / Indicators Aching;Sore   ? ?  ?  ? ?  ? ? ? ? ? ? ? ? ? ? ? ? ? ? ? ? ? ? ? ? [facility] Adult PT Treatment/Exercise - 01/15/22 0001   ? ?  ? Lumbar Exercises: Standing  ? Other Standing Lumbar Exercises antirotation green TB x 10 each side   ?  ? Knee/Hip Exercises: Stretches  ? Passive Hamstring Stretch Right;Left;2 reps;30 seconds   ? Passive Hamstring Stretch Limitations supine   ?  ? Knee/Hip Exercises: Aerobic  ? Nustep L5 x 5 min for warm up   ?  ? Knee/Hip Exercises: Standing  ? Heel Raises 20 reps   ? Lateral Step Up Right;Left;10 reps;Step Height: 4";Hand Hold: 0   ? Forward Step Up Right;Left;2 sets;10 reps;Step Height: 4";Hand Hold: 0   ? Wall Squat 10 reps    ? Wall Squat Limitations tolerable range   ? SLS 30 sec bilat with intermittent UE support   ? Other Standing Knee Exercises side steps green TB above knees x 8 ft x 5 reps each side   ?  ? Knee/Hip Exercises: Supine  ? Bridges with Clamshell Strengthening;Both;20 reps   green TB proximal to knees  ? ?  ?  ? ?  ? ? ? ? ? ? ? ? ? ? PT Education - 01/15/22 1332   ? ? Education Details updated HEP   ? Person(s) Educated Patient   ? Methods Explanation;Demonstration;Handout   ? Comprehension Verbalized understanding;Returned demonstration   ? ?  ?  ? ?  ? ? ? ? ? ? PT Long Term Goals - 12/16/21 1253   ? ?  ? PT LONG TERM GOAL #1  ? Title Decrease pain in bilat knees by 25-50% allowing patient to be more active in home   ? Time 6   ? Period Weeks   ? Status New   ? Target Date 01/27/22   ?  ?  PT LONG TERM GOAL #2  ? Title Increase strength to 4/5 t0 5/5 bilat LE's   ? Time 6   ? Period Weeks   ? Status New   ? Target Date 01/27/22   ?  ? PT LONG TERM GOAL #3  ? Title Patient reports ability to ascend and descend 13 steps in home with greater stability and less pain to safely move to and from bedroom to living area in home   ? Time 6   ? Period Weeks   ? Status New   ? Target Date 01/27/22   ?  ? PT LONG TERM GOAL #4  ? Title Independent in [place] (including aquatic program as indicated)   ? Time 6   ? Period Weeks   ? Status New   ? Target Date 01/27/22   ?  ? PT LONG TERM GOAL #5  ? Title Improve functional limitation score to 36   ? Time 6   ? Period Weeks   ? Status New   ? Target Date 01/27/22   ? ?  ?  ? ?  ? ? ? ? ? ? ? ? Plan - 01/15/22 1357   ? ? Clinical Impression Statement Session focused on knee strengthening today. Added wall squat and SLS with pt with good tolerance. HEP updated   ? PT Next Visit Plan progress with LE and mid back strengthening as tolerated   ? PT Home Exercise Plan [email or web address]   ? Consulted and Agree with Plan of Care Patient;Family member/caregiver   ? Family Member Consulted daughter -  Casey Powell   ? ?  ?  ? ?  ? ? ?Patient will benefit from skilled therapeutic intervention in order to improve the following deficits and impairments:    ? ?Visit Diagnosis: ?Mid back pain, chronic ? ?Chronic pain of left knee ? ?Chronic pain of right knee ? ?Muscle weakness (generalized) ? ?Other symptoms and signs involving the musculoskeletal system ? ? ? ? ?Problem List ?Patient Active Problem List  ? Diagnosis Date Noted  ? DDD (degenerative disc disease), cervical 01/13/2022  ? DDD (degenerative disc disease), lumbar 01/13/2022  ? Primary osteoarthritis of both knees 11/30/2021  ? Chronic right shoulder pain 11/30/2021  ? DDD (degenerative disc disease), thoracic 11/30/2021  ? ? ?Casey Powell, PT ?01/15/2022, 1:58 PM ? ?[place] ?[facility] ?[place] ?[place], [name], [address] ?Phone: [phone]   Fax:  [phone] ? ?Name: Casey Powell ?MRN: [id number] ?Date of Birth: [birth year] ? ? ? ?

## 2022-01-15 NOTE — Patient Instructions (Signed)
Access Code: [email or web address] ?URL: [email or web address] ?Date: 01/15/2022 ?Prepared by: Isabelle Course ? ?Exercises ?Prone Quadriceps Stretch with Strap - 2 x daily - 7 x weekly - 1 sets - 3 reps - 30 sec hold ?Hooklying Hamstring Stretch with Strap - 2 x daily - 7 x weekly - 1 sets - 3 reps - 30 sec hold ?Standing Bilateral Low Shoulder Row with Anchored Resistance - 1 x daily - 7 x weekly - 2 sets - 10 reps ?Shoulder Extension with Resistance - 1 x daily - 7 x weekly - 2 sets - 10 reps ?Seated Thoracic Flexion and Rotation with Arms Crossed - 1 x daily - 7 x weekly - 1 sets - 10 reps - 10 seconds hold ?Standing Shoulder External Rotation with Resistance - 2 x daily - 7 x weekly - 1-3 sets - 10 reps - 2-3 sec hold ?Anti-Rotation Lateral Stepping with Press - 2 x daily - 7 x weekly - 1-2 sets - 10 reps - 2-3 sec hold ?Bridge with Hip Abduction and Resistance - 2 x daily - 7 x weekly - 1-2 sets - 10 reps - 3 sec hold ?Side Stepping with Resistance at Thighs - 1 x daily - 7 x weekly ?Standing Single Leg Stance with Counter Support - 1 x daily - 7 x weekly - 1 sets - 5 reps - 30 seconds hold ?Wall Quarter Squat - 1 x daily - 7 x weekly - 2 sets - 10 reps - 3 sec hold ? ?

## 2022-01-19 ENCOUNTER — Ambulatory Visit: Payer: Medicaid Other | Admitting: Physical Therapy

## 2022-01-20 ENCOUNTER — Other Ambulatory Visit: Payer: Self-pay

## 2022-01-20 ENCOUNTER — Ambulatory Visit: Payer: Medicaid Other | Admitting: Physical Therapy

## 2022-01-20 DIAGNOSIS — R29898 Other symptoms and signs involving the musculoskeletal system: Secondary | ICD-10-CM

## 2022-01-20 DIAGNOSIS — M549 Dorsalgia, unspecified: Secondary | ICD-10-CM | POA: Diagnosis not present

## 2022-01-20 DIAGNOSIS — M25562 Pain in left knee: Secondary | ICD-10-CM | POA: Diagnosis not present

## 2022-01-20 DIAGNOSIS — G8929 Other chronic pain: Secondary | ICD-10-CM

## 2022-01-20 DIAGNOSIS — M6281 Muscle weakness (generalized): Secondary | ICD-10-CM | POA: Diagnosis not present

## 2022-01-20 DIAGNOSIS — M25561 Pain in right knee: Secondary | ICD-10-CM

## 2022-01-20 NOTE — Therapy (Signed)
[place] ?[facility] ?[address] ?[place], [name], [address] ?Phone: [phone]   Fax:  [phone] ? ?Physical Therapy Treatment ? ?Patient Details  ?Name: Casey Powell ?MRN: [id number] ?Date of Birth: Jul 23, 1956 ?Referring Provider (PT): Dr Dianah Field ? ? ?Encounter Date: 01/20/2022 ? ? PT End of Session - 01/20/22 1059   ? ? Visit Number 8   ? Number of Visits 12   ? Date for PT Re-Evaluation 01/27/22   ? PT Start Time 1012   ? PT Stop Time 1050   ? PT Time Calculation (min) 38 min   ? Activity Tolerance Patient tolerated treatment well   ? Behavior During Therapy [facility] for tasks assessed/performed   ? ?  ?  ? ?  ? ? ?Past Medical History:  ?Diagnosis Date  ? Asthma   ? GERD (gastroesophageal reflux disease)   ? Hyperlipidemia   ? Hypertension   ? ? ?Past Surgical History:  ?Procedure Laterality Date  ? ABDOMINAL HYSTERECTOMY    ? TONSILLECTOMY    ? ? ?There were no vitals filed for this visit. ? ? Subjective Assessment - 01/20/22 1017   ? ? Subjective Pt states she feels her back is stronger, her main complaint continues to be knee pain   ? Patient Stated Goals get up and down stairs   ? Currently in Pain? Yes   ? Pain Score 5    ? Pain Location Knee   ? Pain Orientation Right   ? Pain Descriptors / Indicators Aching;Sore   ? Pain Type Chronic pain   ? ?  ?  ? ?  ? ? ? ? ? [facility] PT Assessment - 01/20/22 0001   ? ?  ? Functional Tests  ? Functional tests Single leg stance   ?  ? Single Leg Stance  ? Comments Rt 28 sec, Lt 30 sec   ? ?  ?  ? ?  ? ? ? ? ? ? ? ? ? ? ? ? ? ? ? ? [place] Adult PT Treatment/Exercise - 01/20/22 0001   ? ?  ? Knee/Hip Exercises: Stretches  ? Passive Hamstring Stretch Right;Left;2 reps;30 seconds   ? Passive Hamstring Stretch Limitations supine   ? Piriformis Stretch 2 reps;Right;Left;30 seconds   ?  ? Knee/Hip Exercises: Aerobic  ? Nustep L5 x 5 min for warm up   ?  ? Knee/Hip Exercises: Standing  ? Heel Raises 20 reps   ? Lateral Step  Up Right;Left;10 reps;Hand Hold: 0;Step Height: 6"   ? Forward Step Up Right;Left;2 sets;10 reps;Hand Hold: 0;Step Height: 6"   ? Wall Squat 10 reps   ? SLS 30 sec bilat with intermittent UE support   ? SLS with Vectors x 5 bilat   ? Other Standing Knee Exercises standing on foam tandem stance 2 x 30 sec intermittent UE support   ?  ? Knee/Hip Exercises: Supine  ? Bridges with Clamshell Strengthening;Both;20 reps   green TB proximal to knees  ? ?  ?  ? ?  ? ? ? ? ? ? ? ? ? ? ? ? ? ? ? PT Long Term Goals - 12/16/21 1253   ? ?  ? PT LONG TERM GOAL #1  ? Title Decrease pain in bilat knees by 25-50% allowing patient to be more active in home   ? Time 6   ? Period Weeks   ? Status New   ? Target Date 01/27/22   ?  ?  PT LONG TERM GOAL #2  ? Title Increase strength to 4/5 t0 5/5 bilat LE's   ? Time 6   ? Period Weeks   ? Status New   ? Target Date 01/27/22   ?  ? PT LONG TERM GOAL #3  ? Title Patient reports ability to ascend and descend 13 steps in home with greater stability and less pain to safely move to and from bedroom to living area in home   ? Time 6   ? Period Weeks   ? Status New   ? Target Date 01/27/22   ?  ? PT LONG TERM GOAL #4  ? Title Independent in [place] (including aquatic program as indicated)   ? Time 6   ? Period Weeks   ? Status New   ? Target Date 01/27/22   ?  ? PT LONG TERM GOAL #5  ? Title Improve functional limitation score to 36   ? Time 6   ? Period Weeks   ? Status New   ? Target Date 01/27/22   ? ?  ?  ? ?  ? ? ? ? ? ? ? ? Plan - 01/20/22 1059   ? ? Clinical Impression Statement Pt has improved balance and flexibility. Able to progress to 6'' steps today with no increased pain. Added piriformis stretch to improve hip flexibility   ? PT Next Visit Plan d/c? progress HEP as tolerated, check goals   ? PT Home Exercise Plan [id number]   ? Consulted and Agree with Plan of Care Patient;Family member/caregiver   ? Family Member Consulted daughter - Karstyn   ? ?  ?  ? ?  ? ? ?Patient will benefit from  skilled therapeutic intervention in order to improve the following deficits and impairments:    ? ?Visit Diagnosis: ?Mid back pain, chronic ? ?Chronic pain of left knee ? ?Chronic pain of right knee ? ?Muscle weakness (generalized) ? ?Other symptoms and signs involving the musculoskeletal system ? ? ? ? ?Problem List ?Patient Active Problem List  ? Diagnosis Date Noted  ? DDD (degenerative disc disease), cervical 01/13/2022  ? DDD (degenerative disc disease), lumbar 01/13/2022  ? Primary osteoarthritis of both knees 11/30/2021  ? Chronic right shoulder pain 11/30/2021  ? DDD (degenerative disc disease), thoracic 11/30/2021  ? ? ?Lenia Housley, PT ?01/20/2022, 11:01 AM ? ?[place] ?[facility] ?[address] ?[place], [name], [address] ?Phone: [phone]   Fax:  [phone] ? ?Name: Casey Powell ?MRN: [id number] ?Date of Birth: [birth year] ? ? ? ?

## 2022-01-22 ENCOUNTER — Ambulatory Visit: Payer: Medicaid Other | Admitting: Physical Therapy

## 2022-01-22 ENCOUNTER — Other Ambulatory Visit: Payer: Self-pay

## 2022-01-22 DIAGNOSIS — G8929 Other chronic pain: Secondary | ICD-10-CM | POA: Diagnosis not present

## 2022-01-22 DIAGNOSIS — R29898 Other symptoms and signs involving the musculoskeletal system: Secondary | ICD-10-CM | POA: Diagnosis not present

## 2022-01-22 DIAGNOSIS — M6281 Muscle weakness (generalized): Secondary | ICD-10-CM

## 2022-01-22 DIAGNOSIS — M25562 Pain in left knee: Secondary | ICD-10-CM | POA: Diagnosis not present

## 2022-01-22 DIAGNOSIS — M25561 Pain in right knee: Secondary | ICD-10-CM | POA: Diagnosis not present

## 2022-01-22 DIAGNOSIS — M549 Dorsalgia, unspecified: Secondary | ICD-10-CM | POA: Diagnosis not present

## 2022-01-22 NOTE — Therapy (Signed)
[place] ?[facility] ?[address] ?[place], [name], [address] ?Phone: [phone]   Fax:  [phone] ? ?Physical Therapy Treatment and Discharge ? ?Patient Details  ?Name: Casey Powell ?MRN: [id number] ?Date of Birth: [birth year] ?Referring Provider (PT): Dr Dianah Field ? ? ?Encounter Date: 01/22/2022 ? ? PT End of Session - 01/22/22 1520   ? ? Visit Number 9   ? Number of Visits 12   ? Date for PT Re-Evaluation 01/27/22   ? PT Start Time 7564   ? PT Stop Time 3329   ? PT Time Calculation (min) 39 min   ? Activity Tolerance Patient tolerated treatment well   ? Behavior During Therapy [facility] for tasks assessed/performed   ? ?  ?  ? ?  ? ? ?Past Medical History:  ?Diagnosis Date  ? Asthma   ? GERD (gastroesophageal reflux disease)   ? Hyperlipidemia   ? Hypertension   ? ? ?Past Surgical History:  ?Procedure Laterality Date  ? ABDOMINAL HYSTERECTOMY    ? TONSILLECTOMY    ? ? ?There were no vitals filed for this visit. ? ? Subjective Assessment - 01/22/22 1448   ? ? Subjective Pt states she continues to feel stronger, continues with knee pain   ? Patient Stated Goals get up and down stairs   ? Currently in Pain? Yes   ? Pain Score 4    ? Pain Location Knee   ? Pain Orientation Right   ? Pain Descriptors / Indicators Sore   ? ?  ?  ? ?  ? ? ? ? ? [facility] PT Assessment - 01/22/22 0001   ? ?  ? Observation/Other Assessments  ? Focus on Therapeutic Outcomes (FOTO)  56   ?  ? Strength  ? Overall Strength Comments bilat LE grossly 4+/5 hip and knee   ? ?  ?  ? ?  ? ? ? ? ? ? ? ? ? ? ? ? ? ? ? ? [place] Adult PT Treatment/Exercise - 01/22/22 0001   ? ?  ? Lumbar Exercises: Standing  ? Row 20 reps   ? Theraband Level (Row) Level 4 (Blue)   ? Shoulder Extension 20 reps   ? Theraband Level (Shoulder Extension) Level 4 (Blue)   ? Other Standing Lumbar Exercises antirotation blue TB x 10 bilat   ?  ? Knee/Hip Exercises: Stretches  ? Passive Hamstring Stretch Right;Left;2 reps;30  seconds   ? Passive Hamstring Stretch Limitations supine   ? Piriformis Stretch 2 reps;Right;Left;30 seconds   ? Piriformis Stretch Limitations seated   ?  ? Knee/Hip Exercises: Aerobic  ? Nustep L5 x 5 min for warm up   ?  ? Knee/Hip Exercises: Standing  ? Heel Raises 20 reps   ? Lateral Step Up Right;Left;10 reps;Hand Hold: 0;Step Height: 6"   ? Forward Step Up Right;Left;2 sets;10 reps;Hand Hold: 0;Step Height: 6"   ? Wall Squat 20 reps   ?  ? Knee/Hip Exercises: Supine  ? Bridges with Clamshell Strengthening;Both;20 reps   green TB proximal to knees  ? ?  ?  ? ?  ? ? ? ? ? ? ? ? ? ? ? ? ? ? ? PT Long Term Goals - 01/22/22 1453   ? ?  ? PT LONG TERM GOAL #1  ? Title Decrease pain in bilat knees by 25-50% allowing patient to be more active in home   ? Status Achieved   ?  ? PT LONG  TERM GOAL #2  ? Title Increase strength to 4/5 t0 5/5 bilat LE's   ? Status Achieved   ?  ? PT LONG TERM GOAL #3  ? Title Patient reports ability to ascend and descend 13 steps in home with greater stability and less pain to safely move to and from bedroom to living area in home   ? Status Achieved   ?  ? PT LONG TERM GOAL #4  ? Title Independent in [place] (including aquatic program as indicated)   ? Status Achieved   ?  ? PT LONG TERM GOAL #5  ? Title Improve functional limitation score to 36   ? Baseline 56   ? Status Achieved   ? ?  ?  ? ?  ? ? ? ? ? ? ? ? Plan - 01/22/22 1521   ? ? Clinical Impression Statement Pt has achieved all goals. She has improved strength and decreased pain. pt ready to d/c to HEP   ? PT Next Visit Plan d/c   ? PT Home Exercise Plan [id number]   ? Consulted and Agree with Plan of Care Patient;Family member/caregiver   ? Family Member Consulted daughter - Helga   ? ?  ?  ? ?  ? ? ?Patient will benefit from skilled therapeutic intervention in order to improve the following deficits and impairments:    ? ?Visit Diagnosis: ?Mid back pain, chronic ? ?Chronic pain of left knee ? ?Chronic pain of right knee ? ?Muscle  weakness (generalized) ? ?Other symptoms and signs involving the musculoskeletal system ? ? ? ? ?Problem List ?Patient Active Problem List  ? Diagnosis Date Noted  ? DDD (degenerative disc disease), cervical 01/13/2022  ? DDD (degenerative disc disease), lumbar 01/13/2022  ? Primary osteoarthritis of both knees 11/30/2021  ? Chronic right shoulder pain 11/30/2021  ? DDD (degenerative disc disease), thoracic 11/30/2021  ? ?PHYSICAL THERAPY DISCHARGE SUMMARY ? ?Visits from Start of Care: 9 ? ?Current functional level related to goals / functional outcomes: ?Improved strength, decreased pain ?  ?Remaining deficits: ?See above ?  ?Education / Equipment: ?HEP  ? ?Patient agrees to discharge. Patient goals were met. Patient is being discharged due to meeting the stated rehab goals. ? ?Prabhjot Piscitello, PT ?01/22/2022, 3:22 PM ? ?[place] ?[facility] ?[address] ?[place], [name], [address] ?Phone: [phone]   Fax:  [phone] ? ?Name: Casey Powell ?MRN: [id number] ?Date of Birth: [birth year] ? ? ? ?

## 2022-02-06 ENCOUNTER — Ambulatory Visit (INDEPENDENT_AMBULATORY_CARE_PROVIDER_SITE_OTHER): Payer: Medicaid Other

## 2022-02-06 ENCOUNTER — Other Ambulatory Visit: Payer: Self-pay

## 2022-02-06 DIAGNOSIS — M549 Dorsalgia, unspecified: Secondary | ICD-10-CM

## 2022-02-06 DIAGNOSIS — M546 Pain in thoracic spine: Secondary | ICD-10-CM

## 2022-02-06 DIAGNOSIS — M545 Low back pain, unspecified: Secondary | ICD-10-CM

## 2022-02-06 DIAGNOSIS — G8929 Other chronic pain: Secondary | ICD-10-CM

## 2022-02-06 DIAGNOSIS — M542 Cervicalgia: Secondary | ICD-10-CM | POA: Diagnosis not present

## 2022-02-08 ENCOUNTER — Ambulatory Visit (INDEPENDENT_AMBULATORY_CARE_PROVIDER_SITE_OTHER): Payer: Medicaid Other | Admitting: Sports Medicine

## 2022-02-08 ENCOUNTER — Other Ambulatory Visit: Payer: Self-pay

## 2022-02-08 DIAGNOSIS — M170 Bilateral primary osteoarthritis of knee: Secondary | ICD-10-CM

## 2022-02-08 DIAGNOSIS — M51369 Other intervertebral disc degeneration, lumbar region without mention of lumbar back pain or lower extremity pain: Secondary | ICD-10-CM

## 2022-02-08 DIAGNOSIS — M5030 Other cervical disc degeneration, unspecified cervical region: Secondary | ICD-10-CM

## 2022-02-08 DIAGNOSIS — M5136 Other intervertebral disc degeneration, lumbar region: Secondary | ICD-10-CM | POA: Diagnosis not present

## 2022-02-08 DIAGNOSIS — M25511 Pain in right shoulder: Secondary | ICD-10-CM

## 2022-02-08 DIAGNOSIS — G8929 Other chronic pain: Secondary | ICD-10-CM

## 2022-02-08 NOTE — Assessment & Plan Note (Signed)
Good sized L3-L4 disc protrusion on the MRI, adding additional herniated disc conditioning before considering an epidural. ?She can return in 6 weeks as needed. ?

## 2022-02-08 NOTE — Progress Notes (Signed)
? ? ?  Procedures performed today:   ? ?None. ? ?Independent interpretation of notes and tests performed by another provider:  ? ?None. ? ?Brief History, Exam, Impression, and Recommendations:   ? ?Chronic right shoulder pain ?This is a pleasant 66 year old female, she had glenohumeral signs at the last visit, failure of conservative treatments we injected her glenohumeral joint, she returns today pain-free with regards to her shoulder. ? ?Primary osteoarthritis of both knees ?Knees are doing well, she can call me back for an injection if needed, continue Celebrex for now. ? ?DDD (degenerative disc disease), cervical ?Cervical spine MRIs were reviewed, she has only mild DDD, overall her cervical spine looks really good, she is having some bilateral trapezial achiness, I asked her to continue her Celebrex, we will give her some cervical conditioning and return as needed, the pain is not bad enough to proceed with intervention. ? ?DDD (degenerative disc disease), lumbar ?Good sized L3-L4 disc protrusion on the MRI, adding additional herniated disc conditioning before considering an epidural. ?She can return in 6 weeks as needed. ? ? ? ?___________________________________________ ?Gwen Her. Dianah Field, M.D., ABFM., CAQSM. ?Primary Care and Sports Medicine ?[place] ? ?Adjunct Instructor of Family Medicine  ?[employer] ?

## 2022-02-08 NOTE — Assessment & Plan Note (Signed)
This is a pleasant 66 year old female, she had glenohumeral signs at the last visit, failure of conservative treatments we injected her glenohumeral joint, she returns today pain-free with regards to her shoulder. ?

## 2022-02-08 NOTE — Assessment & Plan Note (Signed)
Cervical spine MRIs were reviewed, she has only mild DDD, overall her cervical spine looks really good, she is having some bilateral trapezial achiness, I asked her to continue her Celebrex, we will give her some cervical conditioning and return as needed, the pain is not bad enough to proceed with intervention. ?

## 2022-02-08 NOTE — Assessment & Plan Note (Signed)
Knees are doing well, she can call me back for an injection if needed, continue Celebrex for now. ?

## 2022-02-13 DIAGNOSIS — Z419 Encounter for procedure for purposes other than remedying health state, unspecified: Secondary | ICD-10-CM | POA: Diagnosis not present

## 2022-03-15 DIAGNOSIS — Z419 Encounter for procedure for purposes other than remedying health state, unspecified: Secondary | ICD-10-CM | POA: Diagnosis not present

## 2022-04-15 DIAGNOSIS — Z419 Encounter for procedure for purposes other than remedying health state, unspecified: Secondary | ICD-10-CM | POA: Diagnosis not present

## 2022-05-04 ENCOUNTER — Other Ambulatory Visit: Payer: Self-pay

## 2022-05-04 DIAGNOSIS — M549 Dorsalgia, unspecified: Secondary | ICD-10-CM

## 2022-05-04 MED ORDER — HYDROCODONE-ACETAMINOPHEN 10-325 MG PO TABS: 1.0000 | ORAL_TABLET | Freq: Three times a day (TID) | ORAL | 0 refills | Status: DC | PRN

## 2022-05-04 NOTE — Telephone Encounter (Signed)
Patient is asking for more than 15 tabs at one time.

## 2022-05-15 DIAGNOSIS — Z419 Encounter for procedure for purposes other than remedying health state, unspecified: Secondary | ICD-10-CM | POA: Diagnosis not present

## 2022-06-03 DIAGNOSIS — E039 Hypothyroidism, unspecified: Secondary | ICD-10-CM | POA: Diagnosis not present

## 2022-06-03 DIAGNOSIS — F330 Major depressive disorder, recurrent, mild: Secondary | ICD-10-CM | POA: Diagnosis not present

## 2022-06-03 DIAGNOSIS — I10 Essential (primary) hypertension: Secondary | ICD-10-CM | POA: Diagnosis not present

## 2022-06-03 DIAGNOSIS — R42 Dizziness and giddiness: Secondary | ICD-10-CM | POA: Diagnosis not present

## 2022-06-03 DIAGNOSIS — R7302 Impaired glucose tolerance (oral): Secondary | ICD-10-CM | POA: Diagnosis not present

## 2022-06-03 DIAGNOSIS — B353 Tinea pedis: Secondary | ICD-10-CM | POA: Diagnosis not present

## 2022-06-03 DIAGNOSIS — R0789 Other chest pain: Secondary | ICD-10-CM | POA: Diagnosis not present

## 2022-06-03 DIAGNOSIS — Z9109 Other allergy status, other than to drugs and biological substances: Secondary | ICD-10-CM | POA: Diagnosis not present

## 2022-06-03 DIAGNOSIS — J4530 Mild persistent asthma, uncomplicated: Secondary | ICD-10-CM | POA: Diagnosis not present

## 2022-06-03 DIAGNOSIS — H409 Unspecified glaucoma: Secondary | ICD-10-CM | POA: Diagnosis not present

## 2022-06-15 DIAGNOSIS — Z419 Encounter for procedure for purposes other than remedying health state, unspecified: Secondary | ICD-10-CM | POA: Diagnosis not present

## 2022-06-29 ENCOUNTER — Ambulatory Visit (INDEPENDENT_AMBULATORY_CARE_PROVIDER_SITE_OTHER): Payer: Medicaid Other | Admitting: Podiatry

## 2022-06-29 ENCOUNTER — Encounter: Payer: Self-pay | Admitting: Podiatry

## 2022-06-29 DIAGNOSIS — B070 Plantar wart: Secondary | ICD-10-CM

## 2022-06-29 DIAGNOSIS — D492 Neoplasm of unspecified behavior of bone, soft tissue, and skin: Secondary | ICD-10-CM

## 2022-06-29 DIAGNOSIS — L409 Psoriasis, unspecified: Secondary | ICD-10-CM

## 2022-06-29 MED ORDER — CLOBETASOL PROPIONATE 0.05 % EX CREA: 1.0000 | TOPICAL_CREAM | Freq: Two times a day (BID) | CUTANEOUS | 0 refills | Status: DC

## 2022-06-29 NOTE — Progress Notes (Signed)
  Subjective:  Patient ID: Casey Powell, female    DOB: 1956-06-12,  MRN: [id number]  Chief Complaint  Patient presents with   Rash    Rash on both feet about 5 years. Patient stated it is getting worst. Patient complains of itchiness and tightness to the skin. Patient has tried taking oral medications and applying topical medication. Patient was taking terbinafine x14 days and last dose was about 10 days ago.    Plantar Warts    Plantar wart to right foot- ball of foot.     66 y.o. female presents with the above complaint. History confirmed with patient.   Objective:  Physical Exam: warm, good capillary refill, no trophic changes or ulcerative lesions, normal DP and PT pulses, normal sensory exam, and dry scaly rash which appears to be more consistent with a plaque psoriasis on the plantar and medial instep of the heel bilateral right is worse than left, there is a small plantar verruca submetatarsal 5 right foot.  Assessment:   1. Psoriasis   2. Plantar verruca      Plan:  Patient was evaluated and treated and all questions answered.  We discussed etiology and treatment options for inflammatory skin disease such as psoriasis and eczema.  I recommended topical treatment with a high potency steroid.  Clobetasol cream was sent to her pharmacy.  Discussed etiology and treatment of verruca plantaris in detail with the patient as well as multiple treatment options including blistering agents, chemotherapeutic agents, surgical excision, laser therapy and the indications and roles of the above.  Today, recommended treatment with Cantharone as noted in procedure note below.    Procedure: Destruction of Lesion Location: Right submetatarsal 5 Instrumentation: 15 blade. Technique: Debridement of lesion to petechial bleeding. Aperture pad applied around lesion. Small amount of canthrone applied to the base of the lesion. Dressing: Dry, sterile, compression dressing. Disposition:  Patient tolerated procedure well. Advised to leave dressing on for 6-8 hours. Thereafter patient to wash the area with soap and water and applied band-aid. Off-loading pads dispensed. Patient to return in 3 weeks for follow-up.   No follow-ups on file.

## 2022-06-29 NOTE — Patient Instructions (Signed)
Take dressing off in 8 hours and wash the foot with soap and water. If it is hurting or becomes uncomfortable before the 8 hours, go ahead and remove the bandage and wash the area.  If it blisters, apply antibiotic ointment and a band-aid.  Monitor for any signs/symptoms of infection. Call the office immediately if any occur or go directly to the emergency room. Call with any questions/concerns.   

## 2022-07-05 DIAGNOSIS — H9201 Otalgia, right ear: Secondary | ICD-10-CM | POA: Diagnosis not present

## 2022-07-05 DIAGNOSIS — Z0000 Encounter for general adult medical examination without abnormal findings: Secondary | ICD-10-CM | POA: Diagnosis not present

## 2022-07-05 DIAGNOSIS — H918X1 Other specified hearing loss, right ear: Secondary | ICD-10-CM | POA: Diagnosis not present

## 2022-07-05 DIAGNOSIS — R6889 Other general symptoms and signs: Secondary | ICD-10-CM | POA: Diagnosis not present

## 2022-07-05 DIAGNOSIS — Z1159 Encounter for screening for other viral diseases: Secondary | ICD-10-CM | POA: Diagnosis not present

## 2022-07-05 DIAGNOSIS — F411 Generalized anxiety disorder: Secondary | ICD-10-CM | POA: Diagnosis not present

## 2022-07-05 DIAGNOSIS — Z23 Encounter for immunization: Secondary | ICD-10-CM | POA: Diagnosis not present

## 2022-07-05 DIAGNOSIS — Z114 Encounter for screening for human immunodeficiency virus [HIV]: Secondary | ICD-10-CM | POA: Diagnosis not present

## 2022-07-05 DIAGNOSIS — E039 Hypothyroidism, unspecified: Secondary | ICD-10-CM | POA: Diagnosis not present

## 2022-07-05 DIAGNOSIS — F330 Major depressive disorder, recurrent, mild: Secondary | ICD-10-CM | POA: Diagnosis not present

## 2022-07-16 DIAGNOSIS — Z419 Encounter for procedure for purposes other than remedying health state, unspecified: Secondary | ICD-10-CM | POA: Diagnosis not present

## 2022-07-20 DIAGNOSIS — H31011 Macula scars of posterior pole (postinflammatory) (post-traumatic), right eye: Secondary | ICD-10-CM | POA: Diagnosis not present

## 2022-07-20 DIAGNOSIS — H2513 Age-related nuclear cataract, bilateral: Secondary | ICD-10-CM | POA: Diagnosis not present

## 2022-07-20 DIAGNOSIS — H40013 Open angle with borderline findings, low risk, bilateral: Secondary | ICD-10-CM | POA: Diagnosis not present

## 2022-08-04 DIAGNOSIS — F330 Major depressive disorder, recurrent, mild: Secondary | ICD-10-CM | POA: Diagnosis not present

## 2022-08-15 DIAGNOSIS — Z419 Encounter for procedure for purposes other than remedying health state, unspecified: Secondary | ICD-10-CM | POA: Diagnosis not present

## 2022-09-01 DIAGNOSIS — B372 Candidiasis of skin and nail: Secondary | ICD-10-CM | POA: Diagnosis not present

## 2022-09-01 DIAGNOSIS — F418 Other specified anxiety disorders: Secondary | ICD-10-CM | POA: Diagnosis not present

## 2022-09-01 DIAGNOSIS — R42 Dizziness and giddiness: Secondary | ICD-10-CM | POA: Diagnosis not present

## 2022-09-07 DIAGNOSIS — H93293 Other abnormal auditory perceptions, bilateral: Secondary | ICD-10-CM | POA: Diagnosis not present

## 2022-09-07 DIAGNOSIS — H9201 Otalgia, right ear: Secondary | ICD-10-CM | POA: Diagnosis not present

## 2022-09-15 DIAGNOSIS — Z419 Encounter for procedure for purposes other than remedying health state, unspecified: Secondary | ICD-10-CM | POA: Diagnosis not present

## 2022-09-20 ENCOUNTER — Telehealth: Payer: Self-pay | Admitting: *Deleted

## 2022-09-20 NOTE — Telephone Encounter (Signed)
Patient said that she has completed her foot cream, has been using for a month, still not completely healed, should something else be sent to pharmacy or a refill on current medicine(Clobetasol).please advise.

## 2022-09-21 ENCOUNTER — Ambulatory Visit (INDEPENDENT_AMBULATORY_CARE_PROVIDER_SITE_OTHER): Payer: Medicaid Other | Admitting: Podiatry

## 2022-09-21 DIAGNOSIS — L409 Psoriasis, unspecified: Secondary | ICD-10-CM

## 2022-09-21 MED ORDER — TACROLIMUS 0.1 % EX OINT: TOPICAL_OINTMENT | Freq: Every day | CUTANEOUS | 0 refills | Status: DC

## 2022-09-21 MED ORDER — CLOBETASOL PROPIONATE 0.05 % EX CREA: 1.0000 | TOPICAL_CREAM | Freq: Every day | CUTANEOUS | 0 refills | Status: DC

## 2022-09-24 NOTE — Progress Notes (Signed)
  Subjective:  Patient ID: Casey Powell, female    DOB: 01/24/1956,  MRN: [id number]  Chief Complaint  Patient presents with   Psoriasis    Follow up on R foot/ foot biopsy - doing much better -still quite itchy and a little painful    66 y.o. female presents with the above complaint. History confirmed with patient.   Objective:  Physical Exam: warm, good capillary refill, no trophic changes or ulcerative lesions, normal DP and PT pulses, normal sensory exam, and dry scaly rash which appears to be more consistent with a plaque psoriasis on the plantar and medial instep of the heel bilateral right is worse than left, overall has had improvement  Assessment:   1. Psoriasis      Plan:  Patient was evaluated and treated and all questions answered.   So far has had some improvement.  I recommend she continue the Temovate ointment and I added additional tacrolimus ointment to this.  Recommended follow-up with dermatology for evaluation for injection or oral therapy.  This was sent.  I will see her back as needed     Return if symptoms worsen or fail to improve.

## 2022-10-04 DIAGNOSIS — F418 Other specified anxiety disorders: Secondary | ICD-10-CM | POA: Diagnosis not present

## 2022-10-04 DIAGNOSIS — B372 Candidiasis of skin and nail: Secondary | ICD-10-CM | POA: Diagnosis not present

## 2022-10-15 DIAGNOSIS — Z419 Encounter for procedure for purposes other than remedying health state, unspecified: Secondary | ICD-10-CM | POA: Diagnosis not present

## 2022-11-04 ENCOUNTER — Telehealth: Payer: Self-pay

## 2022-11-04 DIAGNOSIS — G8929 Other chronic pain: Secondary | ICD-10-CM

## 2022-11-04 MED ORDER — HYDROCODONE-ACETAMINOPHEN 10-325 MG PO TABS: 1.0000 | ORAL_TABLET | Freq: Three times a day (TID) | ORAL | 0 refills | Status: DC | PRN

## 2022-11-04 NOTE — Telephone Encounter (Signed)
Patient was last seen on 02/08/22 for her shoulder pain and both knee pain and DDD and she would like a refill on hydrocodone please advise.

## 2022-11-04 NOTE — Telephone Encounter (Signed)
Sent, no further narcotic refills.

## 2022-11-04 NOTE — Addendum Note (Signed)
Addended by: Silverio Decamp on: 11/04/2022 03:21 PM   Modules accepted: Orders

## 2022-11-12 ENCOUNTER — Telehealth: Payer: Self-pay

## 2022-11-12 NOTE — Telephone Encounter (Addendum)
Initiated Prior authorization ZOX:[id number] 10-325MG  tablets Via: Covermymeds Case/Key: [id number] Status: approved  as of 11/12/22 Reason:This drug has been approved. Approved quantity: 30 tablets per 10 day(s). You may fill up to a 34 day supply at a retail pharmacy. You may fill up to a 90 day supply for maintenance drugs, please refer to the formulary for details. Authorization Expiration Date: 05/11/2023 Notified Pt via: pt doe not have Mychart ,called pt left detailed vm

## 2022-11-15 DIAGNOSIS — Z419 Encounter for procedure for purposes other than remedying health state, unspecified: Secondary | ICD-10-CM | POA: Diagnosis not present

## 2022-12-16 DIAGNOSIS — Z419 Encounter for procedure for purposes other than remedying health state, unspecified: Secondary | ICD-10-CM | POA: Diagnosis not present

## 2023-01-10 ENCOUNTER — Other Ambulatory Visit: Payer: Self-pay | Admitting: Podiatry

## 2023-01-14 DIAGNOSIS — Z419 Encounter for procedure for purposes other than remedying health state, unspecified: Secondary | ICD-10-CM | POA: Diagnosis not present

## 2023-02-03 IMAGING — DX DG KNEE COMPLETE 4+V*L*
5 series · 5 of 5 positions shown · non-contrast
Comparison: None.

CLINICAL DATA: Chronic knee pain

EXAM:
LEFT KNEE - COMPLETE 4+ VIEW

[knee lat]
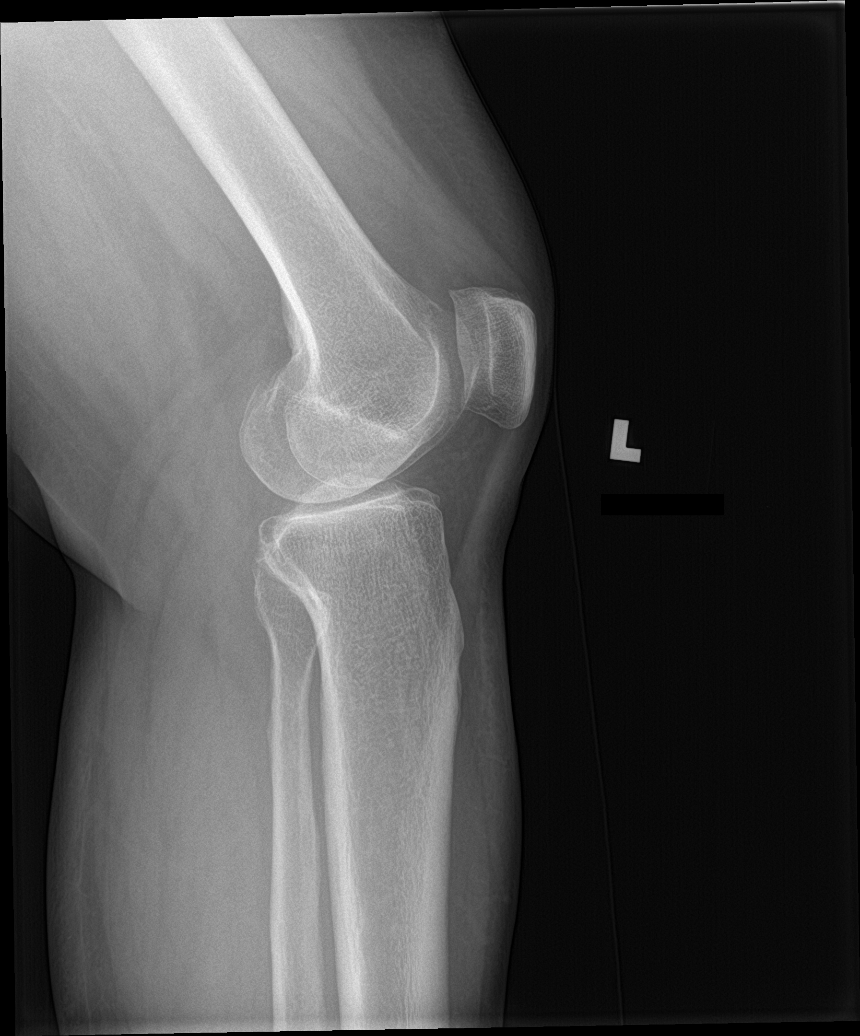

[knee ap bilat standing (1 of 3)]
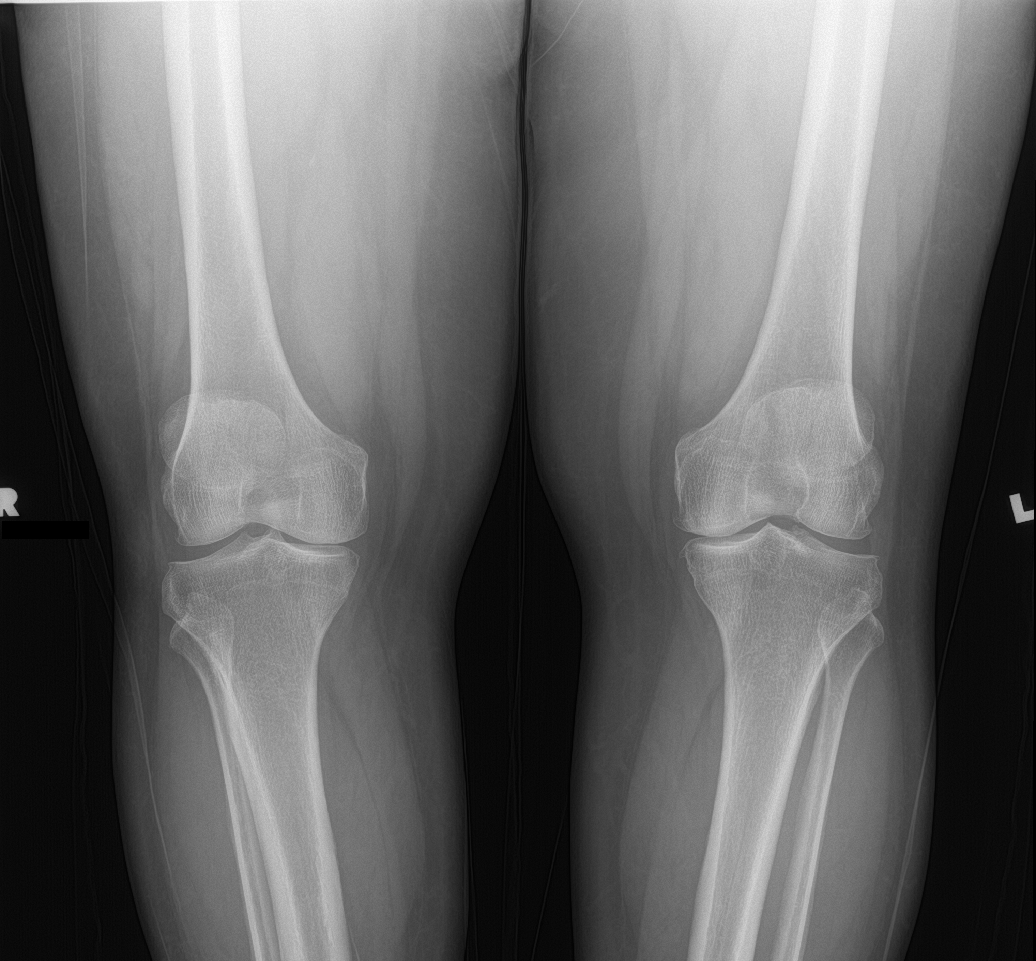

[knee sunrise standing]
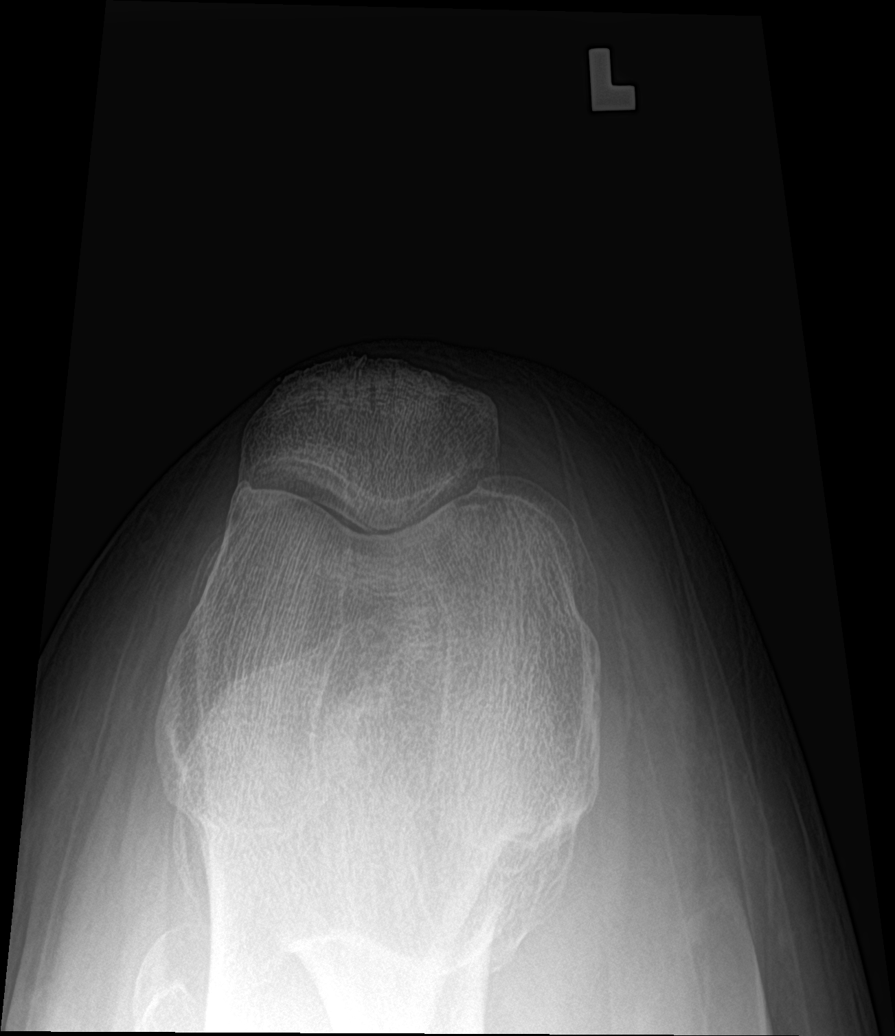

[knee ap bilat standing (2 of 3)]
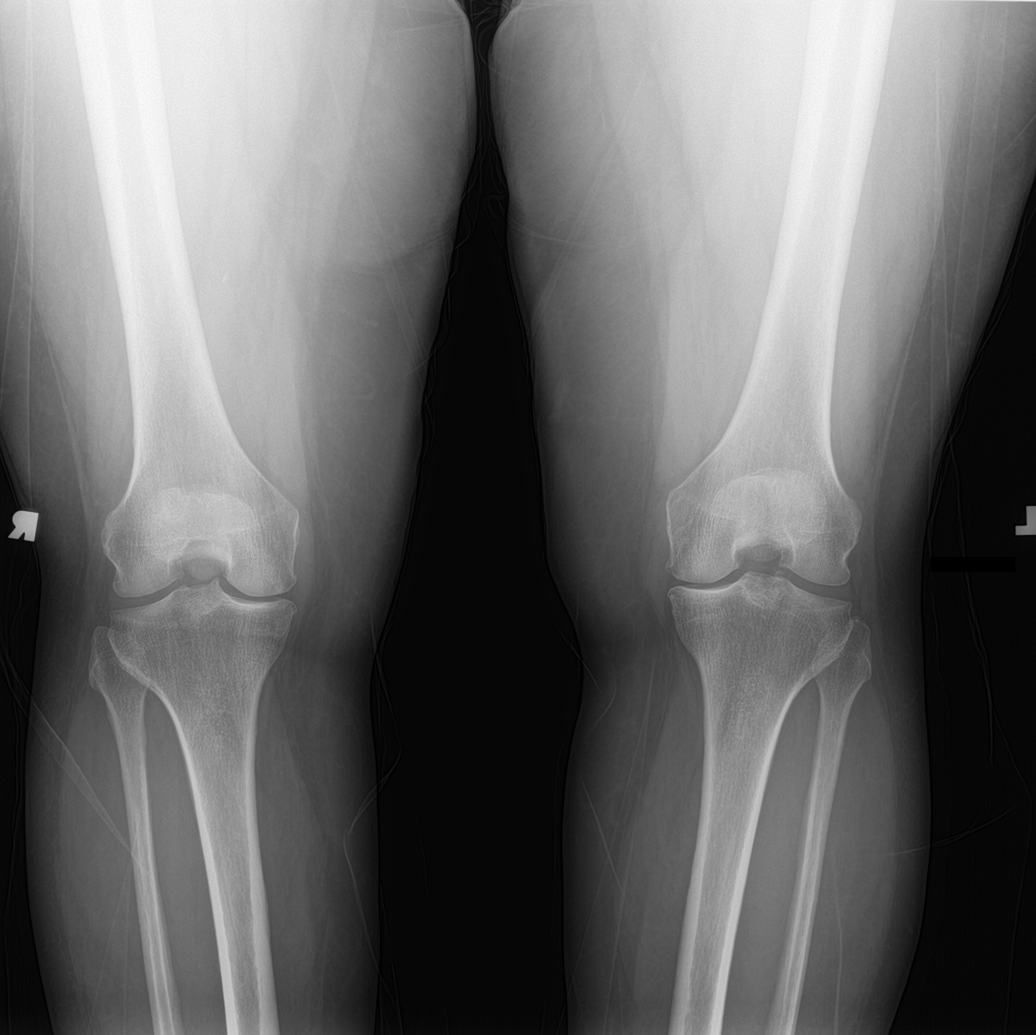

[knee ap bilat standing (3 of 3)]
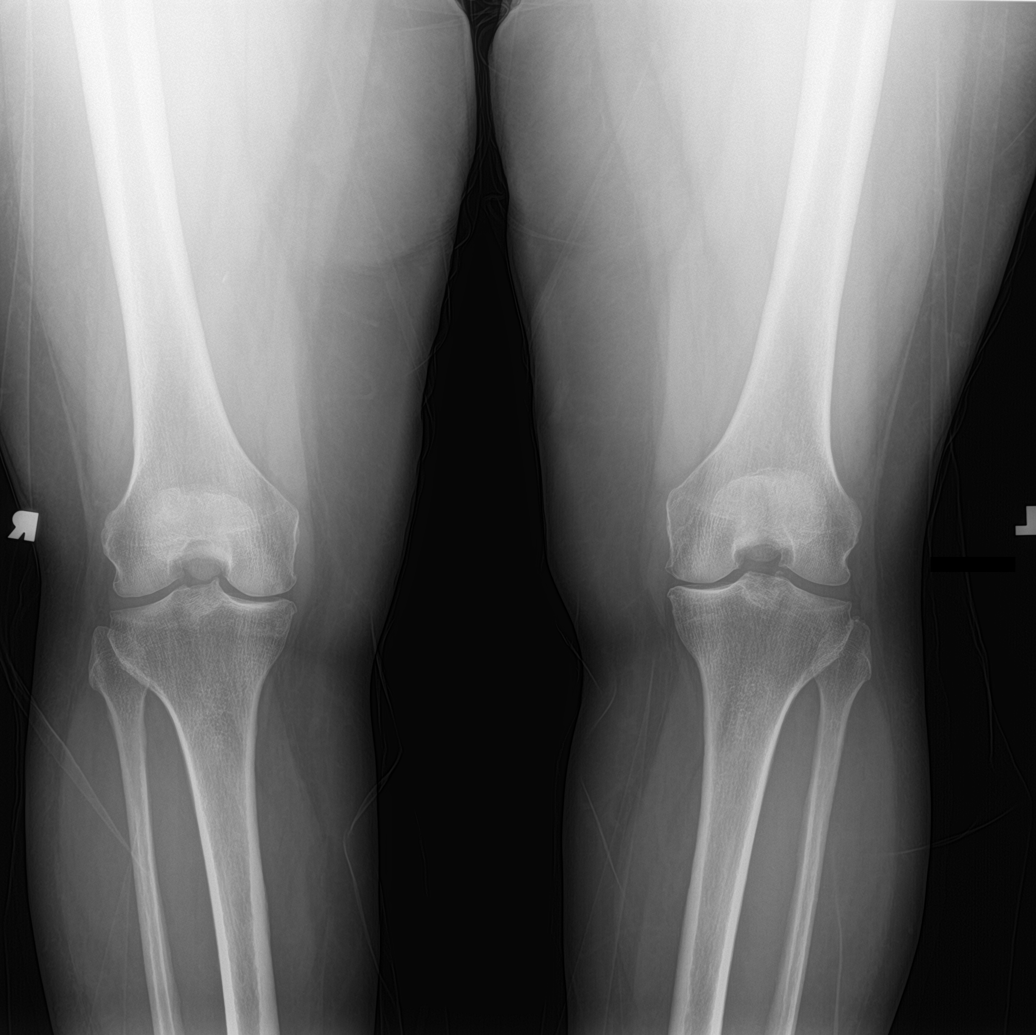

[5 of 5 positions shown; findings below may reference images not displayed]

FINDINGS: No fracture or malalignment. Tricompartment arthritis of the knee
with mild to moderate medial joint space narrowing. No sizable knee
effusion
IMPRESSION: Tricompartment arthritis of the knee, worst involving the medial
patellofemoral compartment.

## 2023-02-03 IMAGING — DX DG THORACIC SPINE 3V
3 series · 3 of 3 positions shown · non-contrast
Comparison: None.

CLINICAL DATA: Back pain

EXAM:
THORACIC SPINE - 3 VIEWS

[t-spine ap]
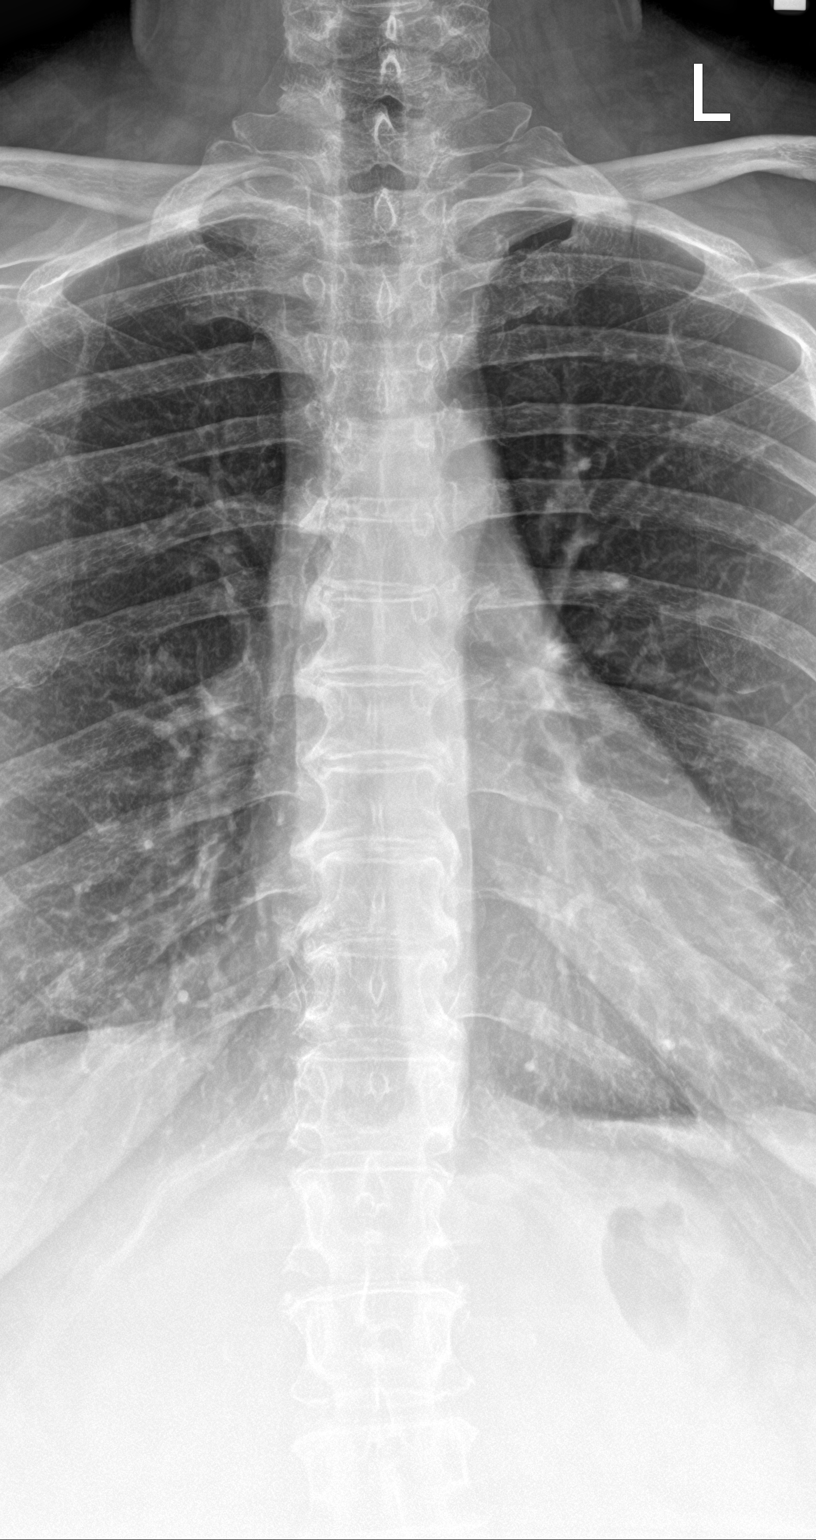

[t-spine lat]
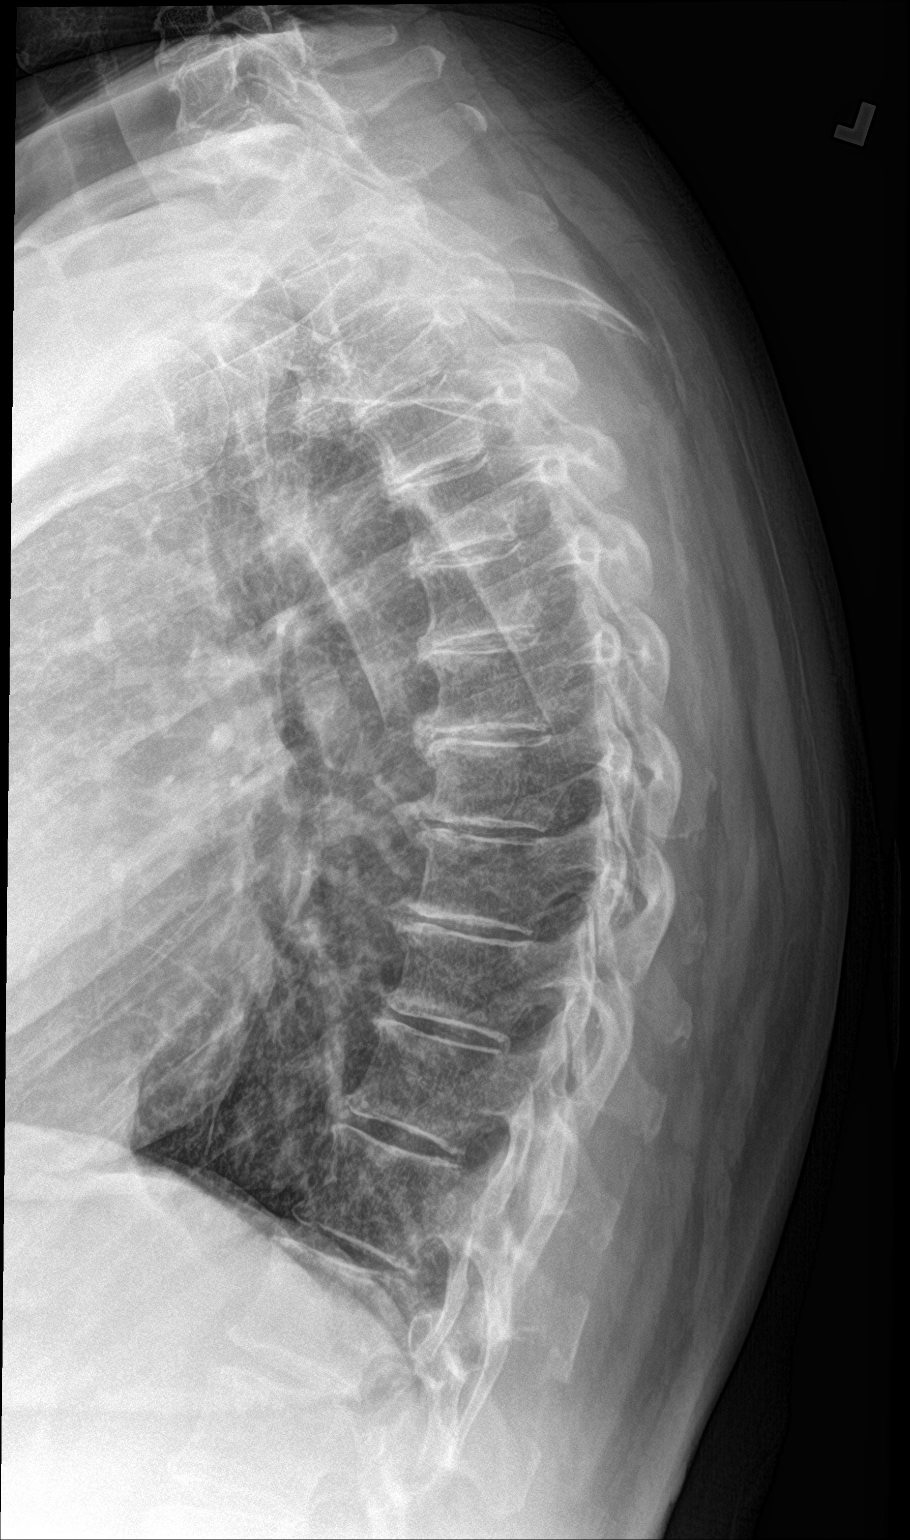

[t-spine swimmers]
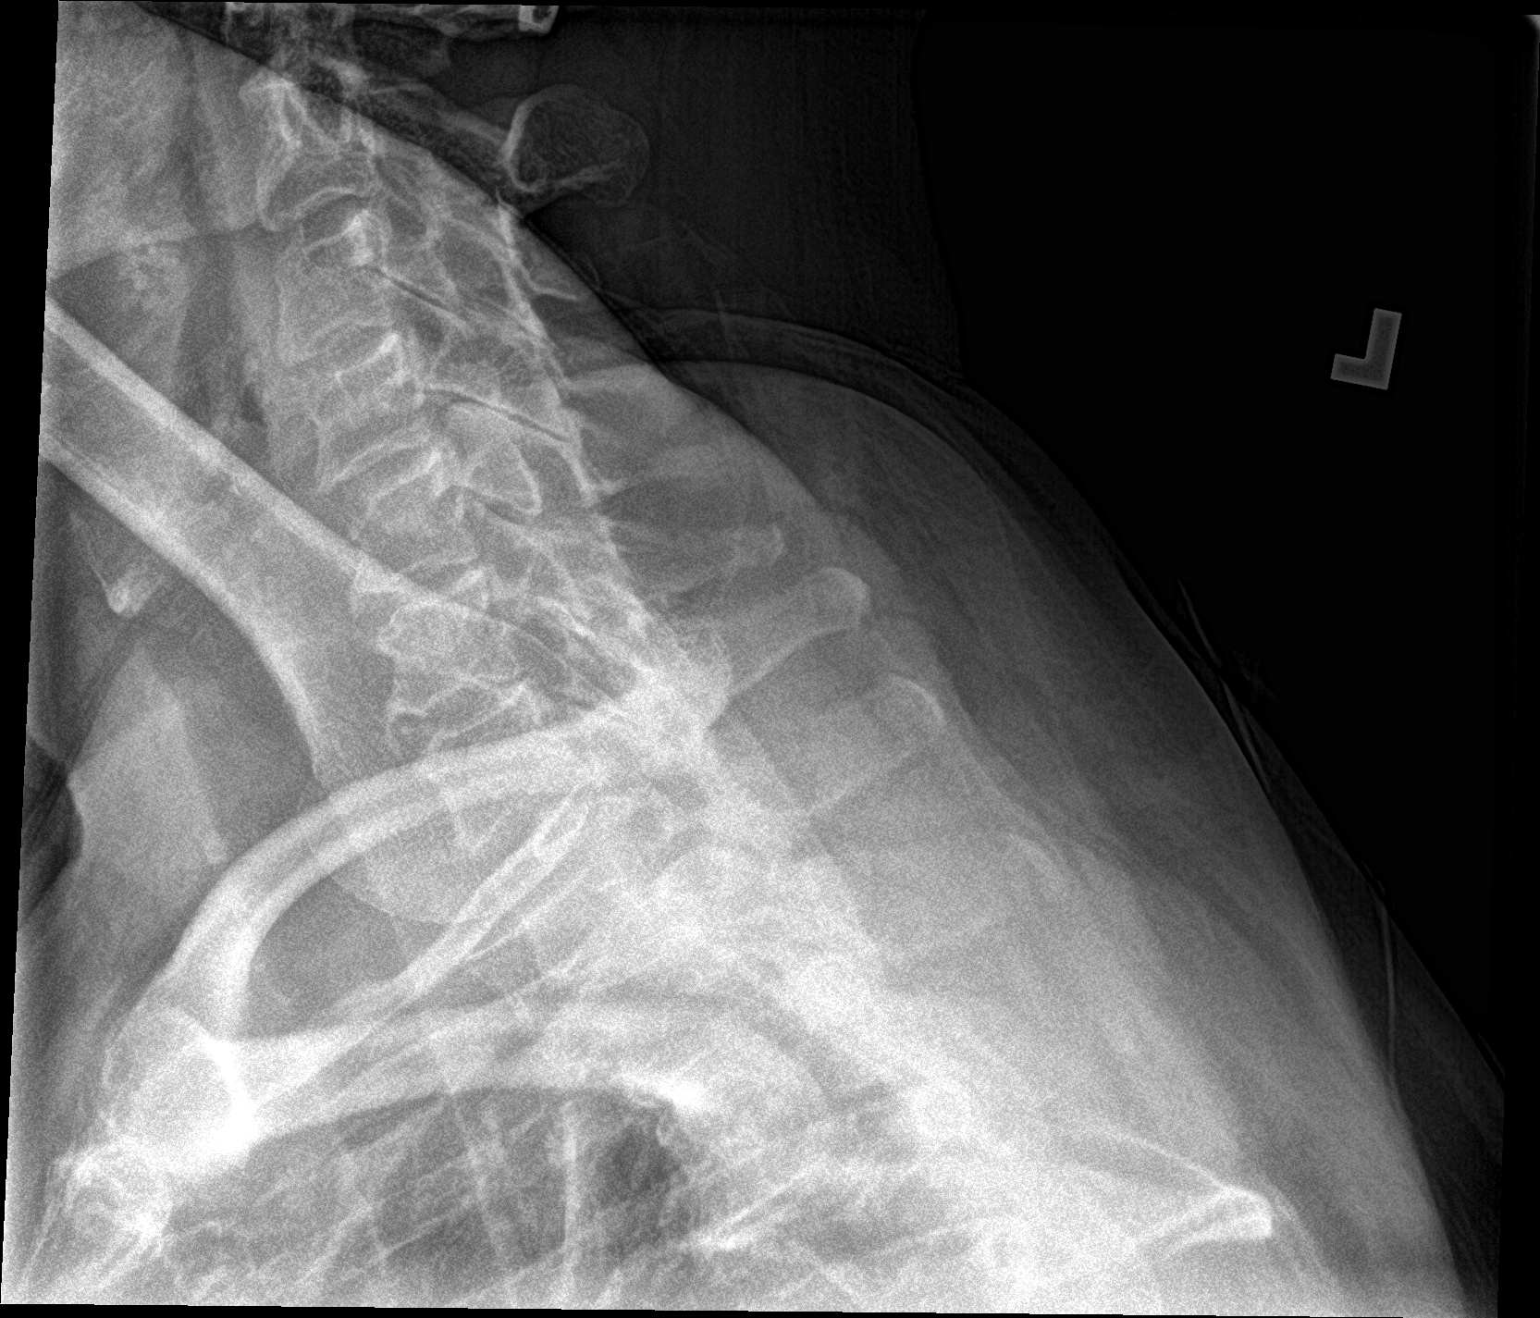

[3 of 3 positions shown; findings below may reference images not displayed]

FINDINGS: Lumbar alignment within normal limits. Vertebral body heights are
maintained. Multilevel degenerative osteophytes of the mid to upper
thoracic spine.
IMPRESSION: Degenerative changes.  No acute osseous abnormality

## 2023-02-14 DIAGNOSIS — Z419 Encounter for procedure for purposes other than remedying health state, unspecified: Secondary | ICD-10-CM | POA: Diagnosis not present

## 2023-03-04 ENCOUNTER — Telehealth: Payer: Self-pay | Admitting: Primary Care

## 2023-03-04 NOTE — Telephone Encounter (Signed)
LVM for patient to schedule follow up apt with PCP for HTN, DM, Mood and asthma for [facility] gaps. AS, CMA

## 2023-03-16 DIAGNOSIS — Z419 Encounter for procedure for purposes other than remedying health state, unspecified: Secondary | ICD-10-CM | POA: Diagnosis not present

## 2023-04-16 DIAGNOSIS — Z419 Encounter for procedure for purposes other than remedying health state, unspecified: Secondary | ICD-10-CM | POA: Diagnosis not present

## 2023-06-27 ENCOUNTER — Telehealth: Payer: Self-pay | Admitting: Sports Medicine

## 2023-06-27 NOTE — Telephone Encounter (Signed)
Patient is requesting a refill on Hydrocodone-acetaminophem 10-325 mg she is out please submit to [facility] on [address] Phone number is [phone]

## 2023-06-27 NOTE — Telephone Encounter (Signed)
She needs an appointment to get refills its been a year since we seen her

## 2023-07-04 ENCOUNTER — Ambulatory Visit (INDEPENDENT_AMBULATORY_CARE_PROVIDER_SITE_OTHER): Payer: Medicaid Other | Admitting: Sports Medicine

## 2023-07-04 DIAGNOSIS — G8929 Other chronic pain: Secondary | ICD-10-CM | POA: Diagnosis not present

## 2023-07-04 DIAGNOSIS — M51369 Other intervertebral disc degeneration, lumbar region without mention of lumbar back pain or lower extremity pain: Secondary | ICD-10-CM

## 2023-07-04 DIAGNOSIS — M5136 Other intervertebral disc degeneration, lumbar region: Secondary | ICD-10-CM | POA: Diagnosis not present

## 2023-07-04 DIAGNOSIS — M170 Bilateral primary osteoarthritis of knee: Secondary | ICD-10-CM | POA: Diagnosis not present

## 2023-07-04 DIAGNOSIS — M549 Dorsalgia, unspecified: Secondary | ICD-10-CM

## 2023-07-04 MED ORDER — PREDNISONE 50 MG PO TABS: ORAL_TABLET | ORAL | 0 refills | Status: DC

## 2023-07-04 MED ORDER — CELECOXIB 200 MG PO CAPS: ORAL_CAPSULE | ORAL | 2 refills | Status: DC

## 2023-07-04 MED ORDER — HYDROCODONE-ACETAMINOPHEN 10-325 MG PO TABS: 1.0000 | ORAL_TABLET | Freq: Three times a day (TID) | ORAL | 0 refills | Status: AC | PRN

## 2023-07-04 NOTE — Progress Notes (Signed)
    Procedures performed today:    None.  Independent interpretation of notes and tests performed by another provider:   None.  Brief History, Exam, Impression, and Recommendations:    Primary osteoarthritis of both knees Known bilateral knee osteoarthritis, last injection was a couple of years ago, she is having recurrence of pain. She will continue Celebrex, she does desire a short course of hydrocodone, happy to do this as well but she understands that we will not be doing any more narcotics. We will add formal physical therapy. Return to see me in 6 weeks, injections if not better.  DDD (degenerative disc disease), lumbar Known lumbar DDD with a good-sized L3-L4 disc protrusion on MRI, we will restart physical therapy, add some prednisone. Hydrocodone as below but this will be the only prescription of hydrocodone she gets, if persistent pain after physical therapy and steroids we will set her up with an epidural injection. Return in 6 weeks.    ____________________________________________ Ihor Austin. Benjamin Stain, M.D., ABFM., CAQSM., AME. Primary Care and Sports Medicine [facility]  Adjunct Professor of Family Medicine  [employer]  Restaurant manager, fast food

## 2023-07-04 NOTE — Assessment & Plan Note (Signed)
Known lumbar DDD with a good-sized L3-L4 disc protrusion on MRI, we will restart physical therapy, add some prednisone. Hydrocodone as below but this will be the only prescription of hydrocodone she gets, if persistent pain after physical therapy and steroids we will set her up with an epidural injection. Return in 6 weeks.

## 2023-07-04 NOTE — Assessment & Plan Note (Signed)
Known bilateral knee osteoarthritis, last injection was a couple of years ago, she is having recurrence of pain. She will continue Celebrex, she does desire a short course of hydrocodone, happy to do this as well but she understands that we will not be doing any more narcotics. We will add formal physical therapy. Return to see me in 6 weeks, injections if not better.

## 2023-07-28 NOTE — Therapy (Signed)
OUTPATIENT PHYSICAL THERAPY THORACOLUMBAR EVALUATION   Patient Name: Casey Powell MRN: [id number] DOB:1956/10/25, 67 y.o., female Today's Date: 08/02/2023  END OF SESSION:  PT End of Session - 08/02/23 1141     Visit Number 1    Date for PT Re-Evaluation 10/11/23    Authorization Type [place] Medicaid    PT Start Time 1145    PT Stop Time 1230    PT Time Calculation (min) 45 min    Activity Tolerance Patient tolerated treatment well    Behavior During Therapy WFL for tasks assessed/performed             Past Medical History:  Diagnosis Date   Asthma    GERD (gastroesophageal reflux disease)    Hyperlipidemia    Hypertension    Past Surgical History:  Procedure Laterality Date   ABDOMINAL HYSTERECTOMY     TONSILLECTOMY     Patient Active Problem List   Diagnosis Date Noted   DDD (degenerative disc disease), cervical 01/13/2022   DDD (degenerative disc disease), lumbar 01/13/2022   Primary osteoarthritis of both knees 11/30/2021   Chronic right shoulder pain 11/30/2021   DDD (degenerative disc disease), thoracic 11/30/2021    PCP: Gwinda Passe  REFERRING PROVIDER: Rodney Langton  REFERRING DIAG:  M51.36 (ICD-10-CM) - DDD (degenerative disc disease), lumbar    Rationale for Evaluation and Treatment: Rehabilitation  THERAPY DIAG:  Other low back pain  Chronic pain of left knee  Chronic pain of right knee  Muscle weakness (generalized)  Other symptoms and signs involving the musculoskeletal system  ONSET DATE: 07/04/23  SUBJECTIVE:                                                                                                                                                                                           SUBJECTIVE STATEMENT: Feel like an old person. I feel it in my back and hips and knees. Feel it down into my calves and also have constant cramping in neck. Did PT last year and it seemed to help.  Daughter is present at visit to  translate  PERTINENT HISTORY:  Known lumbar DDD with a good-sized L3-L4 disc protrusion on MRI  PAIN:  Are you having pain? Yes: NPRS scale: 9/10 Pain location: back, knees Pain description: back feels like someone broke it, neck and down leg is just tight  Aggravating factors: standing, stress, walking far distances  Relieving factors: pain meds, shots in shoulder and knees   PRECAUTIONS: None  RED FLAGS: None   WEIGHT BEARING RESTRICTIONS: No  FALLS:  Has patient fallen in last 6 months? No  LIVING ENVIRONMENT: Lives  with: lives with their family Lives in: House/apartment Stairs: Yes: Internal: 15 steps; on right going up and External: 3 steps; none Has following equipment at home: None  OCCUPATION: Not working  PLOF: Independent  PATIENT GOALS: be able to climb stairs easier, lift up arms better, ease the back pain   NEXT MD VISIT: 08/16/23  OBJECTIVE:   DIAGNOSTIC FINDINGS:  Mild degenerative changes, without spinal canal stenosis or neural foraminal narrowing in the cervical, thoracic, or lumbar spine.  COGNITION: Overall cognitive status: Within functional limits for tasks assessed     SENSATION: WFL  MUSCLE LENGTH: Hamstrings: mild tightness bilaterally   POSTURE: rounded shoulders, forward head, and increased thoracic kyphosis  PALPATION: TTP L4-L5 and SIJ   LUMBAR ROM:   AROM eval  Flexion Able to touch toes  Extension 50%   Right lateral flexion Mid thigh with pain  Left lateral flexion Mid thigh with pain  Right rotation WFL  Left rotation WFL   (Blank rows = not tested)  LOWER EXTREMITY ROM:  WNL   LOWER EXTREMITY MMT:    MMT Right eval Left eval  Hip flexion 5 5  Hip extension    Hip abduction 4 4  Hip adduction 5 5  Hip internal rotation    Hip external rotation    Knee flexion 5 5  Knee extension 4 4  Ankle dorsiflexion    Ankle plantarflexion    Ankle inversion    Ankle eversion     (Blank rows = not  tested)  LUMBAR SPECIAL TESTS:  Straight leg raise test: Positive and FABER test: Positive  FUNCTIONAL TESTS:  5 times sit to stand: 12.58s Timed up and go (TUG): 12.35s   TODAY'S TREATMENT:                                                                                                                              DATE: EVAL 08/02/23    PATIENT EDUCATION:  Education details: POC and HEP Person educated: Patient Education method: Explanation Education comprehension: verbalized understanding  HOME EXERCISE PROGRAM: Access Code: [email or web address] URL: [email or web address] Date: 08/02/2023 Prepared by: Cassie Freer  Exercises - Supine Lower Trunk Rotation  - 1 x daily - 7 x weekly - 2 sets - 10 reps - Supine Bridge  - 1 x daily - 7 x weekly - 2 sets - 10 reps - Supine Single Knee to Chest Stretch  - 1 x daily - 7 x weekly - 2 reps - 15 hold - Clamshell  - 1 x daily - 7 x weekly - 2 sets - 10 reps - Seated Upper Trapezius Stretch  - 1 x daily - 7 x weekly - 2 reps - 30 hold  ASSESSMENT:  CLINICAL IMPRESSION: Patient is a 67 y.o. female who was seen today for physical therapy evaluation and treatment for LBP. She reports pain in her low back, knees, hips and neck. She feels very tight and stiff overall.  She reports difficulty with stairs and with prolonged standing and walking. She was in PT last year in [place] and reports it did help with her pain but now everything has returned. Patient will benefit from skilled PT to address her pain and impairments to be able to increase her mobility and activity tolerance to complete household chores with ease.   OBJECTIVE IMPAIRMENTS: difficulty walking, decreased ROM, decreased strength, impaired flexibility, improper body mechanics, postural dysfunction, and pain.   ACTIVITY LIMITATIONS: standing, squatting, stairs, and locomotion level  REHAB POTENTIAL: Good  CLINICAL DECISION MAKING: Stable/uncomplicated  EVALUATION  COMPLEXITY: Low   GOALS: Goals reviewed with patient? Yes  SHORT TERM GOALS: Target date: 09/06/23  Patient will be independent with initial HEP.  Goal status: INITIAL   LONG TERM GOALS: Target date: 10/11/23  Patient will be independent with advanced/ongoing HEP to improve outcomes and carryover.  Goal status: INITIAL  2.  Patient will report 75% improvement in low back pain to improve QOL.  Baseline: 9/10 Goal status: INITIAL  3.  Patient will demonstrate full pain free lumbar ROM to perform ADLs.   Baseline: see chart Goal status: INITIAL  4.  Patient will tolerate 30 min of (standing/sitting/walking) to perform household chores. Baseline: needs rest breaks while cooking and cleaning d/t pain Goal status: INITIAL  5.  Patient will be able to go up and down stairs without pain Baseline: difficulty with stairs  Goal status: INITIAL  PLAN:  PT FREQUENCY: 2x/week  PT DURATION: 10 weeks  PLANNED INTERVENTIONS: Therapeutic exercises, Therapeutic activity, Neuromuscular re-education, Balance training, Gait training, Patient/Family education, Self Care, Joint mobilization, Stair training, Dry Needling, Electrical stimulation, Spinal manipulation, Spinal mobilization, Cryotherapy, Moist heat, and Manual therapy.  PLAN FOR NEXT SESSION: LB stretching and strengthening, STM    Cassie Freer, PT 08/02/2023, 12:33 PM

## 2023-08-02 ENCOUNTER — Ambulatory Visit: Payer: Medicaid Other | Attending: Sports Medicine

## 2023-08-02 DIAGNOSIS — R29898 Other symptoms and signs involving the musculoskeletal system: Secondary | ICD-10-CM | POA: Insufficient documentation

## 2023-08-02 DIAGNOSIS — M25562 Pain in left knee: Secondary | ICD-10-CM | POA: Insufficient documentation

## 2023-08-02 DIAGNOSIS — M5136 Other intervertebral disc degeneration, lumbar region: Secondary | ICD-10-CM | POA: Insufficient documentation

## 2023-08-02 DIAGNOSIS — G8929 Other chronic pain: Secondary | ICD-10-CM | POA: Insufficient documentation

## 2023-08-02 DIAGNOSIS — M6281 Muscle weakness (generalized): Secondary | ICD-10-CM | POA: Insufficient documentation

## 2023-08-02 DIAGNOSIS — M5459 Other low back pain: Secondary | ICD-10-CM | POA: Insufficient documentation

## 2023-08-02 DIAGNOSIS — M25561 Pain in right knee: Secondary | ICD-10-CM | POA: Insufficient documentation

## 2023-08-11 ENCOUNTER — Ambulatory Visit: Payer: Medicaid Other | Admitting: Physical Therapy

## 2023-08-16 ENCOUNTER — Ambulatory Visit: Payer: Medicaid Other | Admitting: Sports Medicine

## 2023-08-18 ENCOUNTER — Ambulatory Visit: Payer: Medicaid Other | Admitting: Physical Therapy

## 2023-08-23 ENCOUNTER — Ambulatory Visit: Payer: Medicaid Other | Admitting: Physical Therapy

## 2023-08-25 ENCOUNTER — Ambulatory Visit: Payer: Medicaid Other | Attending: Sports Medicine | Admitting: Physical Therapy

## 2023-08-25 ENCOUNTER — Encounter: Payer: Self-pay | Admitting: Physical Therapy

## 2023-08-25 DIAGNOSIS — M25561 Pain in right knee: Secondary | ICD-10-CM | POA: Insufficient documentation

## 2023-08-25 DIAGNOSIS — R29898 Other symptoms and signs involving the musculoskeletal system: Secondary | ICD-10-CM | POA: Diagnosis present

## 2023-08-25 DIAGNOSIS — M549 Dorsalgia, unspecified: Secondary | ICD-10-CM | POA: Diagnosis present

## 2023-08-25 DIAGNOSIS — M5459 Other low back pain: Secondary | ICD-10-CM | POA: Diagnosis present

## 2023-08-25 DIAGNOSIS — M25562 Pain in left knee: Secondary | ICD-10-CM | POA: Insufficient documentation

## 2023-08-25 DIAGNOSIS — M6281 Muscle weakness (generalized): Secondary | ICD-10-CM | POA: Diagnosis present

## 2023-08-25 DIAGNOSIS — G8929 Other chronic pain: Secondary | ICD-10-CM | POA: Insufficient documentation

## 2023-08-25 NOTE — Therapy (Signed)
OUTPATIENT PHYSICAL THERAPY THORACOLUMBAR TREATMENT   Patient Name: Casey Powell MRN: [id number] DOB:[birth year], 67 y.o., female Today's Date: 08/25/2023  END OF SESSION:  PT End of Session - 08/25/23 1744     Visit Number 2    Date for PT Re-Evaluation 10/11/23    Authorization Type [place] Medicaid 1/10    PT Start Time 1743    PT Stop Time 1830    PT Time Calculation (min) 47 min    Activity Tolerance Patient tolerated treatment well    Behavior During Therapy WFL for tasks assessed/performed             Past Medical History:  Diagnosis Date   Asthma    GERD (gastroesophageal reflux disease)    Hyperlipidemia    Hypertension    Past Surgical History:  Procedure Laterality Date   ABDOMINAL HYSTERECTOMY     TONSILLECTOMY     Patient Active Problem List   Diagnosis Date Noted   DDD (degenerative disc disease), cervical 01/13/2022   DDD (degenerative disc disease), lumbar 01/13/2022   Primary osteoarthritis of both knees 11/30/2021   Chronic right shoulder pain 11/30/2021   DDD (degenerative disc disease), thoracic 11/30/2021    PCP: Gwinda Passe  REFERRING PROVIDER: Rodney Langton  REFERRING DIAG:  M51.36 (ICD-10-CM) - DDD (degenerative disc disease), lumbar    Rationale for Evaluation and Treatment: Rehabilitation  THERAPY DIAG:  Other low back pain  Chronic pain of left knee  Chronic pain of right knee  Muscle weakness (generalized)  ONSET DATE: 07/04/23  SUBJECTIVE:                                                                                                                                                                                           SUBJECTIVE STATEMENT: I tried the exercises , okay.  Sore and tired, tight all over  Daughter is present at visit to translate  PERTINENT HISTORY:  Known lumbar DDD with a good-sized L3-L4 disc protrusion on MRI  PAIN:  Are you having pain? Yes: NPRS scale: 9/10 Pain location:  back, knees Pain description: back feels like someone broke it, neck and down leg is just tight  Aggravating factors: standing, stress, walking far distances  Relieving factors: pain meds, shots in shoulder and knees   PRECAUTIONS: None  RED FLAGS: None   WEIGHT BEARING RESTRICTIONS: No  FALLS:  Has patient fallen in last 6 months? No  LIVING ENVIRONMENT: Lives with: lives with their family Lives in: House/apartment Stairs: Yes: Internal: 15 steps; on right going up and External: 3 steps; none Has following equipment at home: None  OCCUPATION: Not working  PLOF: Independent  PATIENT GOALS: be able to climb stairs easier, lift up arms better, ease the back pain   NEXT MD VISIT: 08/16/23  OBJECTIVE:   DIAGNOSTIC FINDINGS:  Mild degenerative changes, without spinal canal stenosis or neural foraminal narrowing in the cervical, thoracic, or lumbar spine.  COGNITION: Overall cognitive status: Within functional limits for tasks assessed     SENSATION: WFL  MUSCLE LENGTH: Hamstrings: mild tightness bilaterally   POSTURE: rounded shoulders, forward head, and increased thoracic kyphosis  PALPATION: TTP L4-L5 and SIJ   LUMBAR ROM:   AROM eval  Flexion Able to touch toes  Extension 50%   Right lateral flexion Mid thigh with pain  Left lateral flexion Mid thigh with pain  Right rotation WFL  Left rotation WFL   (Blank rows = not tested)  LOWER EXTREMITY ROM:  WNL   LOWER EXTREMITY MMT:    MMT Right eval Left eval  Hip flexion 5 5  Hip extension    Hip abduction 4 4  Hip adduction 5 5  Hip internal rotation    Hip external rotation    Knee flexion 5 5  Knee extension 4 4  Ankle dorsiflexion    Ankle plantarflexion    Ankle inversion    Ankle eversion     (Blank rows = not tested)  LUMBAR SPECIAL TESTS:  Straight leg raise test: Positive and FABER test: Positive  FUNCTIONAL TESTS:  5 times sit to stand: 12.58s Timed up and go (TUG):  12.35s   TODAY'S TREATMENT:                                                                                                                              DATE:  08/25/23 Nustep level 4 x 5 minutes Red tband HS curls Red tband row Red tband extension Red tband clamshells Ball b/n knees squeeze Feet on ball K2C, trunk rotation, small bridge, iso abs 2.5# supine SAQ STM with theragun to the upper traps, rhomboids and the lumbar area while she was sitting  EVAL 08/02/23    PATIENT EDUCATION:  Education details: POC and HEP Person educated: Patient Education method: Explanation Education comprehension: verbalized understanding  HOME EXERCISE PROGRAM: Access Code: [email or web address] URL: [email or web address] Date: 08/02/2023 Prepared by: Cassie Freer  Exercises - Supine Lower Trunk Rotation  - 1 x daily - 7 x weekly - 2 sets - 10 reps - Supine Bridge  - 1 x daily - 7 x weekly - 2 sets - 10 reps - Supine Single Knee to Chest Stretch  - 1 x daily - 7 x weekly - 2 reps - 15 hold - Clamshell  - 1 x daily - 7 x weekly - 2 sets - 10 reps - Seated Upper Trapezius Stretch  - 1 x daily - 7 x weekly - 2 reps - 30 hold  ASSESSMENT:  CLINICAL IMPRESSION: Patient is a 67 y.o. female who was seen today for physical therapy evaluation  and treatment for LBP. She reports pain in her low back, knees, hips and neck. She feels very tight and stiff overall. She reports difficulty with stairs and with prolonged standing and walking.  C/O tightness and stiffness in the back neck and knees. I initiated exercises that were mostly sitting and some in supine, she did well, I also started STM, she reported feeling better when leaving  OBJECTIVE IMPAIRMENTS: difficulty walking, decreased ROM, decreased strength, impaired flexibility, improper body mechanics, postural dysfunction, and pain.   ACTIVITY LIMITATIONS: standing, squatting, stairs, and locomotion level  REHAB POTENTIAL: Good  CLINICAL  DECISION MAKING: Stable/uncomplicated  EVALUATION COMPLEXITY: Low   GOALS: Goals reviewed with patient? Yes  SHORT TERM GOALS: Target date: 09/06/23  Patient will be independent with initial HEP.  Goal status: INITIAL   LONG TERM GOALS: Target date: 10/11/23  Patient will be independent with advanced/ongoing HEP to improve outcomes and carryover.  Goal status: INITIAL  2.  Patient will report 75% improvement in low back pain to improve QOL.  Baseline: 9/10 Goal status: INITIAL  3.  Patient will demonstrate full pain free lumbar ROM to perform ADLs.   Baseline: see chart Goal status: INITIAL  4.  Patient will tolerate 30 min of (standing/sitting/walking) to perform household chores. Baseline: needs rest breaks while cooking and cleaning d/t pain Goal status: INITIAL  5.  Patient will be able to go up and down stairs without pain Baseline: difficulty with stairs  Goal status: INITIAL  PLAN:  PT FREQUENCY: 2x/week  PT DURATION: 10 weeks  PLANNED INTERVENTIONS: Therapeutic exercises, Therapeutic activity, Neuromuscular re-education, Balance training, Gait training, Patient/Family education, Self Care, Joint mobilization, Stair training, Dry Needling, Electrical stimulation, Spinal manipulation, Spinal mobilization, Cryotherapy, Moist heat, and Manual therapy.  PLAN FOR NEXT SESSION: LB stretching and strengthening, STM    Nadalyn Deringer W, PT 08/25/2023, 5:45 PM

## 2023-08-29 NOTE — Therapy (Signed)
OUTPATIENT PHYSICAL THERAPY THORACOLUMBAR TREATMENT   Patient Name: Casey Powell MRN: [id number] DOB:11/02/1956, 67 y.o., female Today's Date: 08/30/2023  END OF SESSION:  PT End of Session - 08/30/23 1057     Visit Number 3    Date for PT Re-Evaluation 10/11/23    Authorization Type [place] Medicaid 1/10    PT Start Time 1057    PT Stop Time 1145    PT Time Calculation (min) 48 min    Activity Tolerance Patient tolerated treatment well    Behavior During Therapy WFL for tasks assessed/performed              Past Medical History:  Diagnosis Date   Asthma    GERD (gastroesophageal reflux disease)    Hyperlipidemia    Hypertension    Past Surgical History:  Procedure Laterality Date   ABDOMINAL HYSTERECTOMY     TONSILLECTOMY     Patient Active Problem List   Diagnosis Date Noted   DDD (degenerative disc disease), cervical 01/13/2022   DDD (degenerative disc disease), lumbar 01/13/2022   Primary osteoarthritis of both knees 11/30/2021   Chronic right shoulder pain 11/30/2021   DDD (degenerative disc disease), thoracic 11/30/2021    PCP: Gwinda Passe  REFERRING PROVIDER: Rodney Langton  REFERRING DIAG:  M51.36 (ICD-10-CM) - DDD (degenerative disc disease), lumbar    Rationale for Evaluation and Treatment: Rehabilitation  THERAPY DIAG:  Other low back pain  Chronic pain of left knee  Chronic pain of right knee  Muscle weakness (generalized)  Other symptoms and signs involving the musculoskeletal system  ONSET DATE: 07/04/23  SUBJECTIVE:                                                                                                                                                                                           SUBJECTIVE STATEMENT: Back is okay, the knee is hurting. Felt really good after last visit, could feel the tension melt away on my shoulder. I was under a lot of stress.   Daughter is present at visit to  translate  PERTINENT HISTORY:  Known lumbar DDD with a good-sized L3-L4 disc protrusion on MRI  PAIN:  Are you having pain? Yes: NPRS scale: 8/10 Pain location: back, knees Pain description: back feels like someone broke it, neck and down leg is just tight  Aggravating factors: standing, stress, walking far distances  Relieving factors: pain meds, shots in shoulder and knees   PRECAUTIONS: None  RED FLAGS: None   WEIGHT BEARING RESTRICTIONS: No  FALLS:  Has patient fallen in last 6 months? No  LIVING ENVIRONMENT: Lives with: lives with their family  Lives in: House/apartment Stairs: Yes: Internal: 15 steps; on right going up and External: 3 steps; none Has following equipment at home: None  OCCUPATION: Not working  PLOF: Independent  PATIENT GOALS: be able to climb stairs easier, lift up arms better, ease the back pain   NEXT MD VISIT: 08/16/23  OBJECTIVE:   DIAGNOSTIC FINDINGS:  Mild degenerative changes, without spinal canal stenosis or neural foraminal narrowing in the cervical, thoracic, or lumbar spine.  COGNITION: Overall cognitive status: Within functional limits for tasks assessed     SENSATION: WFL  MUSCLE LENGTH: Hamstrings: mild tightness bilaterally   POSTURE: rounded shoulders, forward head, and increased thoracic kyphosis  PALPATION: TTP L4-L5 and SIJ   LUMBAR ROM:   AROM eval  Flexion Able to touch toes  Extension 50%   Right lateral flexion Mid thigh with pain  Left lateral flexion Mid thigh with pain  Right rotation WFL  Left rotation WFL   (Blank rows = not tested)  LOWER EXTREMITY ROM:  WNL   LOWER EXTREMITY MMT:    MMT Right eval Left eval  Hip flexion 5 5  Hip extension    Hip abduction 4 4  Hip adduction 5 5  Hip internal rotation    Hip external rotation    Knee flexion 5 5  Knee extension 4 4  Ankle dorsiflexion    Ankle plantarflexion    Ankle inversion    Ankle eversion     (Blank rows = not  tested)  LUMBAR SPECIAL TESTS:  Straight leg raise test: Positive and FABER test: Positive  FUNCTIONAL TESTS:  5 times sit to stand: 12.58s Timed up and go (TUG): 12.35s   TODAY'S TREATMENT:                                                                                                                              DATE:  08/30/23 Bike L1 x2 mins stopped due to knee pain Red band rows and ext 2x10 STS with red ball OHP 2x10 3# LAQ 2x10 HS curls green 2x10 Fitter pushes 2 blue bands  Passive supine stretching HS, glutes, piriformis, trunk rotations, SK2C Feet on pball rotations and knees to chest STM with theragun to the upper traps, rhomboids and the lumbar area while she was sitting   08/25/23 Nustep level 4 x 5 minutes Red tband HS curls Red tband row Red tband extension Red tband clamshells Ball b/n knees squeeze Feet on ball K2C, trunk rotation, small bridge, iso abs 2.5# supine SAQ STM with theragun to the upper traps, rhomboids and the lumbar area while she was sitting  EVAL 08/02/23    PATIENT EDUCATION:  Education details: POC and HEP Person educated: Patient Education method: Explanation Education comprehension: verbalized understanding  HOME EXERCISE PROGRAM: Access Code: [email or web address] URL: [email or web address] Date: 08/02/2023 Prepared by: Cassie Freer  Exercises - Supine Lower Trunk Rotation  - 1 x daily - 7 x weekly - 2 sets -  10 reps - Supine Bridge  - 1 x daily - 7 x weekly - 2 sets - 10 reps - Supine Single Knee to Chest Stretch  - 1 x daily - 7 x weekly - 2 reps - 15 hold - Clamshell  - 1 x daily - 7 x weekly - 2 sets - 10 reps - Seated Upper Trapezius Stretch  - 1 x daily - 7 x weekly - 2 reps - 30 hold  ASSESSMENT:  CLINICAL IMPRESSION: Patient is a 67 y.o. female who was seen today for physical therapy treatment for LBP and chronic knee pain. She feels very tight and stiff overall. Exercises today focused mostly on light  strengthening for knees and some mobility work for back. She is very tight in upper traps and has some trigger points that are painful with pressure. Reports massage and STM with theragun were really helpful and made her feel a lot better.  OBJECTIVE IMPAIRMENTS: difficulty walking, decreased ROM, decreased strength, impaired flexibility, improper body mechanics, postural dysfunction, and pain.   ACTIVITY LIMITATIONS: standing, squatting, stairs, and locomotion level  REHAB POTENTIAL: Good  CLINICAL DECISION MAKING: Stable/uncomplicated  EVALUATION COMPLEXITY: Low   GOALS: Goals reviewed with patient? Yes  SHORT TERM GOALS: Target date: 09/06/23  Patient will be independent with initial HEP.  Goal status: INITIAL   LONG TERM GOALS: Target date: 10/11/23  Patient will be independent with advanced/ongoing HEP to improve outcomes and carryover.  Goal status: INITIAL  2.  Patient will report 75% improvement in low back pain to improve QOL.  Baseline: 9/10 Goal status: INITIAL  3.  Patient will demonstrate full pain free lumbar ROM to perform ADLs.   Baseline: see chart Goal status: INITIAL  4.  Patient will tolerate 30 min of (standing/sitting/walking) to perform household chores. Baseline: needs rest breaks while cooking and cleaning d/t pain Goal status: INITIAL  5.  Patient will be able to go up and down stairs without pain Baseline: difficulty with stairs  Goal status: INITIAL  PLAN:  PT FREQUENCY: 2x/week  PT DURATION: 10 weeks  PLANNED INTERVENTIONS: Therapeutic exercises, Therapeutic activity, Neuromuscular re-education, Balance training, Gait training, Patient/Family education, Self Care, Joint mobilization, Stair training, Dry Needling, Electrical stimulation, Spinal manipulation, Spinal mobilization, Cryotherapy, Moist heat, and Manual therapy.  PLAN FOR NEXT SESSION: LB stretching and strengthening, STM    Cassie Freer, PT 08/30/2023, 11:43 AM

## 2023-08-30 ENCOUNTER — Ambulatory Visit: Payer: Medicaid Other

## 2023-08-30 DIAGNOSIS — M5459 Other low back pain: Secondary | ICD-10-CM

## 2023-08-30 DIAGNOSIS — M6281 Muscle weakness (generalized): Secondary | ICD-10-CM

## 2023-08-30 DIAGNOSIS — R29898 Other symptoms and signs involving the musculoskeletal system: Secondary | ICD-10-CM

## 2023-08-30 DIAGNOSIS — G8929 Other chronic pain: Secondary | ICD-10-CM

## 2023-08-30 NOTE — Therapy (Signed)
OUTPATIENT PHYSICAL THERAPY THORACOLUMBAR TREATMENT   Patient Name: Casey Powell MRN: [id number] DOB:1956/07/02, 67 y.o., female Today's Date: 09/01/2023  END OF SESSION:  PT End of Session - 09/01/23 1054     Visit Number 4    Date for PT Re-Evaluation 10/11/23    Authorization Type  Medicaid 1/10    PT Start Time 1100    PT Stop Time 1145    PT Time Calculation (min) 45 min    Activity Tolerance Patient tolerated treatment well    Behavior During Therapy WFL for tasks assessed/performed               Past Medical History:  Diagnosis Date   Asthma    GERD (gastroesophageal reflux disease)    Hyperlipidemia    Hypertension    Past Surgical History:  Procedure Laterality Date   ABDOMINAL HYSTERECTOMY     TONSILLECTOMY     Patient Active Problem List   Diagnosis Date Noted   DDD (degenerative disc disease), cervical 01/13/2022   DDD (degenerative disc disease), lumbar 01/13/2022   Primary osteoarthritis of both knees 11/30/2021   Chronic right shoulder pain 11/30/2021   DDD (degenerative disc disease), thoracic 11/30/2021    PCP: Gwinda Passe  REFERRING PROVIDER: Rodney Langton  REFERRING DIAG:  M51.36 (ICD-10-CM) - DDD (degenerative disc disease), lumbar    Rationale for Evaluation and Treatment: Rehabilitation  THERAPY DIAG:  Other low back pain  Chronic pain of left knee  Chronic pain of right knee  Muscle weakness (generalized)  Other symptoms and signs involving the musculoskeletal system  Mid back pain, chronic  ONSET DATE: 07/04/23  SUBJECTIVE:                                                                                                                                                                                           SUBJECTIVE STATEMENT: Felt really good after last, the stretches were good. The knee still hurts. The shoulder feels better too, it is not as bad as it was.   Daughter is present at visit to  translate  PERTINENT HISTORY:  Known lumbar DDD with a good-sized L3-L4 disc protrusion on MRI  PAIN:  Are you having pain? Yes: NPRS scale: 6/10 Pain location: back, knees Pain description: back feels like someone broke it, neck and down leg is just tight  Aggravating factors: standing, stress, walking far distances  Relieving factors: pain meds, shots in shoulder and knees   PRECAUTIONS: None  RED FLAGS: None   WEIGHT BEARING RESTRICTIONS: No  FALLS:  Has patient fallen in last 6 months? No  LIVING ENVIRONMENT: Lives with: lives  with their family Lives in: House/apartment Stairs: Yes: Internal: 15 steps; on right going up and External: 3 steps; none Has following equipment at home: None  OCCUPATION: Not working  PLOF: Independent  PATIENT GOALS: be able to climb stairs easier, lift up arms better, ease the back pain   NEXT MD VISIT: 08/16/23  OBJECTIVE:   DIAGNOSTIC FINDINGS:  Mild degenerative changes, without spinal canal stenosis or neural foraminal narrowing in the cervical, thoracic, or lumbar spine.  COGNITION: Overall cognitive status: Within functional limits for tasks assessed     SENSATION: WFL  MUSCLE LENGTH: Hamstrings: mild tightness bilaterally   POSTURE: rounded shoulders, forward head, and increased thoracic kyphosis  PALPATION: TTP L4-L5 and SIJ   LUMBAR ROM:   AROM eval  Flexion Able to touch toes  Extension 50%   Right lateral flexion Mid thigh with pain  Left lateral flexion Mid thigh with pain  Right rotation WFL  Left rotation WFL   (Blank rows = not tested)  LOWER EXTREMITY ROM:  WNL   LOWER EXTREMITY MMT:    MMT Right eval Left eval  Hip flexion 5 5  Hip extension    Hip abduction 4 4  Hip adduction 5 5  Hip internal rotation    Hip external rotation    Knee flexion 5 5  Knee extension 4 4  Ankle dorsiflexion    Ankle plantarflexion    Ankle inversion    Ankle eversion     (Blank rows = not  tested)  LUMBAR SPECIAL TESTS:  Straight leg raise test: Positive and FABER test: Positive  FUNCTIONAL TESTS:  5 times sit to stand: 12.58s Timed up and go (TUG): 12.35s   TODAY'S TREATMENT:                                                                                                                              DATE:  09/01/23 Calf stretch 15s x2 NuStep L5 x60mins  Shoulder ext 5# 2x10  Box taps 6"  Pec stretch in doorway 30s x2 blackTB ext 2x10  Leg press 20# x10, 30# x10 Calf raises 2x10  STM to upper traps and low back, use of theragun Pball rotations and flexion x10   08/30/23 Bike L1 x2 mins stopped due to knee pain Red band rows and ext 2x10 STS with red ball OHP 2x10 3# LAQ 2x10 HS curls green 2x10 Fitter pushes 2 blue bands  Passive supine stretching HS, glutes, piriformis, trunk rotations, SK2C Feet on pball rotations and knees to chest STM with theragun to the upper traps, rhomboids and the lumbar area while she was sitting   08/25/23 Nustep level 4 x 5 minutes Red tband HS curls Red tband row Red tband extension Red tband clamshells Ball b/n knees squeeze Feet on ball K2C, trunk rotation, small bridge, iso abs 2.5# supine SAQ STM with theragun to the upper traps, rhomboids and the lumbar area while she was sitting  EVAL 08/02/23    PATIENT EDUCATION:  Education details: POC and HEP Person educated: Patient Education method: Explanation Education comprehension: verbalized understanding  HOME EXERCISE PROGRAM: Access Code: [email or web address] URL: [email or web address] Date: 08/02/2023 Prepared by: Cassie Freer  Exercises - Supine Lower Trunk Rotation  - 1 x daily - 7 x weekly - 2 sets - 10 reps - Supine Bridge  - 1 x daily - 7 x weekly - 2 sets - 10 reps - Supine Single Knee to Chest Stretch  - 1 x daily - 7 x weekly - 2 reps - 15 hold - Clamshell  - 1 x daily - 7 x weekly - 2 sets - 10 reps - Seated Upper Trapezius Stretch  - 1 x daily  - 7 x weekly - 2 reps - 30 hold  ASSESSMENT:  CLINICAL IMPRESSION: Patient is a 67 y.o. female who was seen today for physical therapy treatment for LBP and chronic knee pain. She and her daughter report that all the exercises and stretching has really been helping and she feels better overall. Still has some tightness in upper traps and low back. Does really well with all interventions, and can keep progressing each visit.   OBJECTIVE IMPAIRMENTS: difficulty walking, decreased ROM, decreased strength, impaired flexibility, improper body mechanics, postural dysfunction, and pain.   ACTIVITY LIMITATIONS: standing, squatting, stairs, and locomotion level  REHAB POTENTIAL: Good  CLINICAL DECISION MAKING: Stable/uncomplicated  EVALUATION COMPLEXITY: Low   GOALS: Goals reviewed with patient? Yes  SHORT TERM GOALS: Target date: 09/06/23  Patient will be independent with initial HEP.  Goal status: INITIAL   LONG TERM GOALS: Target date: 10/11/23  Patient will be independent with advanced/ongoing HEP to improve outcomes and carryover.  Goal status: INITIAL  2.  Patient will report 75% improvement in low back pain to improve QOL.  Baseline: 9/10 Goal status: INITIAL  3.  Patient will demonstrate full pain free lumbar ROM to perform ADLs.   Baseline: see chart Goal status: INITIAL  4.  Patient will tolerate 30 min of (standing/sitting/walking) to perform household chores. Baseline: needs rest breaks while cooking and cleaning d/t pain Goal status: INITIAL  5.  Patient will be able to go up and down stairs without pain Baseline: difficulty with stairs  Goal status: INITIAL  PLAN:  PT FREQUENCY: 2x/week  PT DURATION: 10 weeks  PLANNED INTERVENTIONS: Therapeutic exercises, Therapeutic activity, Neuromuscular re-education, Balance training, Gait training, Patient/Family education, Self Care, Joint mobilization, Stair training, Dry Needling, Electrical stimulation, Spinal  manipulation, Spinal mobilization, Cryotherapy, Moist heat, and Manual therapy.  PLAN FOR NEXT SESSION: LB stretching and strengthening, STM    Cassie Freer, PT 09/01/2023, 11:43 AM

## 2023-09-01 ENCOUNTER — Ambulatory Visit: Payer: Medicaid Other

## 2023-09-01 DIAGNOSIS — M5459 Other low back pain: Secondary | ICD-10-CM | POA: Diagnosis not present

## 2023-09-01 DIAGNOSIS — R29898 Other symptoms and signs involving the musculoskeletal system: Secondary | ICD-10-CM

## 2023-09-01 DIAGNOSIS — G8929 Other chronic pain: Secondary | ICD-10-CM

## 2023-09-01 DIAGNOSIS — M6281 Muscle weakness (generalized): Secondary | ICD-10-CM

## 2023-09-05 NOTE — Therapy (Signed)
OUTPATIENT PHYSICAL THERAPY THORACOLUMBAR TREATMENT   Patient Name: Casey Powell MRN: [id number] DOB:1956/08/03, 67 y.o., female Today's Date: 09/06/2023  END OF SESSION:  PT End of Session - 09/06/23 1101     Visit Number 5    Date for PT Re-Evaluation 10/11/23    Authorization Type [place] Medicaid 4/10    PT Start Time 1100    PT Stop Time 1145    PT Time Calculation (min) 45 min    Activity Tolerance Patient tolerated treatment well    Behavior During Therapy WFL for tasks assessed/performed                Past Medical History:  Diagnosis Date   Asthma    GERD (gastroesophageal reflux disease)    Hyperlipidemia    Hypertension    Past Surgical History:  Procedure Laterality Date   ABDOMINAL HYSTERECTOMY     TONSILLECTOMY     Patient Active Problem List   Diagnosis Date Noted   DDD (degenerative disc disease), cervical 01/13/2022   DDD (degenerative disc disease), lumbar 01/13/2022   Primary osteoarthritis of both knees 11/30/2021   Chronic right shoulder pain 11/30/2021   DDD (degenerative disc disease), thoracic 11/30/2021    PCP: Gwinda Passe  REFERRING PROVIDER: Rodney Langton  REFERRING DIAG:  M51.36 (ICD-10-CM) - DDD (degenerative disc disease), lumbar    Rationale for Evaluation and Treatment: Rehabilitation  THERAPY DIAG:  Chronic pain of right knee  Other low back pain  Mid back pain, chronic  Muscle weakness (generalized)  ONSET DATE: 07/04/23  SUBJECTIVE:                                                                                                                                                                                           SUBJECTIVE STATEMENT: The back is okay, my knees hurt more.   Daughter is present at visit to translate  PERTINENT HISTORY:  Known lumbar DDD with a good-sized L3-L4 disc protrusion on MRI  PAIN:  Are you having pain? Yes: NPRS scale: 6/10 Pain location: back, knees Pain  description: back feels like someone broke it, neck and down leg is just tight  Aggravating factors: standing, stress, walking far distances  Relieving factors: pain meds, shots in shoulder and knees   PRECAUTIONS: None  RED FLAGS: None   WEIGHT BEARING RESTRICTIONS: No  FALLS:  Has patient fallen in last 6 months? No  LIVING ENVIRONMENT: Lives with: lives with their family Lives in: House/apartment Stairs: Yes: Internal: 15 steps; on right going up and External: 3 steps; none Has following equipment at home: None  OCCUPATION: Not working  PLOF:  Independent  PATIENT GOALS: be able to climb stairs easier, lift up arms better, ease the back pain   NEXT MD VISIT: 08/16/23  OBJECTIVE:   DIAGNOSTIC FINDINGS:  Mild degenerative changes, without spinal canal stenosis or neural foraminal narrowing in the cervical, thoracic, or lumbar spine.  COGNITION: Overall cognitive status: Within functional limits for tasks assessed     SENSATION: WFL  MUSCLE LENGTH: Hamstrings: mild tightness bilaterally   POSTURE: rounded shoulders, forward head, and increased thoracic kyphosis  PALPATION: TTP L4-L5 and SIJ   LUMBAR ROM:   AROM eval  Flexion Able to touch toes  Extension 50%   Right lateral flexion Mid thigh with pain  Left lateral flexion Mid thigh with pain  Right rotation WFL  Left rotation WFL   (Blank rows = not tested)  LOWER EXTREMITY ROM:  WNL   LOWER EXTREMITY MMT:    MMT Right eval Left eval  Hip flexion 5 5  Hip extension    Hip abduction 4 4  Hip adduction 5 5  Hip internal rotation    Hip external rotation    Knee flexion 5 5  Knee extension 4 4  Ankle dorsiflexion    Ankle plantarflexion    Ankle inversion    Ankle eversion     (Blank rows = not tested)  LUMBAR SPECIAL TESTS:  Straight leg raise test: Positive and FABER test: Positive  FUNCTIONAL TESTS:  5 times sit to stand: 12.58s Timed up and go (TUG): 12.35s   TODAY'S TREATMENT:                                                                                                                               DATE:  09/06/23 Step ups 4" Seated row 20# 2x10 Lat pull downs 20# 2x10  NuStep L5 x7mins  HS curls 20# 2x10  Leg ext 5# x10, 10# x10   STS with WaTe shoulder flexion 3# 2x10 Lateral band steps with green band  Seated HS stretch 15s each side   09/01/23 Calf stretch 15s x2 NuStep L5 x72mins  Shoulder ext 5# 2x10  Box taps 6"  Pec stretch in doorway 30s x2 blackTB ext 2x10  Leg press 20# x10, 30# x10 Calf raises 2x10  STM to upper traps and low back, use of theragun Pball rotations and flexion x10   08/30/23 Bike L1 x2 mins stopped due to knee pain Red band rows and ext 2x10 STS with red ball OHP 2x10 3# LAQ 2x10 HS curls green 2x10 Fitter pushes 2 blue bands  Passive supine stretching HS, glutes, piriformis, trunk rotations, SK2C Feet on pball rotations and knees to chest STM with theragun to the upper traps, rhomboids and the lumbar area while she was sitting   08/25/23 Nustep level 4 x 5 minutes Red tband HS curls Red tband row Red tband extension Red tband clamshells Ball b/n knees squeeze Feet on ball K2C, trunk rotation, small bridge, iso  abs 2.5# supine SAQ STM with theragun to the upper traps, rhomboids and the lumbar area while she was sitting  EVAL 08/02/23    PATIENT EDUCATION:  Education details: POC and HEP Person educated: Patient Education method: Explanation Education comprehension: verbalized understanding  HOME EXERCISE PROGRAM: Access Code: [email or web address] URL: [email or web address] Date: 08/02/2023 Prepared by: Cassie Freer  Exercises - Supine Lower Trunk Rotation  - 1 x daily - 7 x weekly - 2 sets - 10 reps - Supine Bridge  - 1 x daily - 7 x weekly - 2 sets - 10 reps - Supine Single Knee to Chest Stretch  - 1 x daily - 7 x weekly - 2 reps - 15 hold - Clamshell  - 1 x daily - 7 x weekly - 2 sets - 10  reps - Seated Upper Trapezius Stretch  - 1 x daily - 7 x weekly - 2 reps - 30 hold  ASSESSMENT:  CLINICAL IMPRESSION: Patient is a 67 y.o. female who was seen today for physical therapy treatment for LBP and chronic knee pain. She reports the R knee is worse than her back. Focused on some strengthening today for both the back and knees. Does really well with all interventions, and can keep progressing each visit. States she feels like all the exercises so far have been really helpful.   OBJECTIVE IMPAIRMENTS: difficulty walking, decreased ROM, decreased strength, impaired flexibility, improper body mechanics, postural dysfunction, and pain.   ACTIVITY LIMITATIONS: standing, squatting, stairs, and locomotion level  REHAB POTENTIAL: Good  CLINICAL DECISION MAKING: Stable/uncomplicated  EVALUATION COMPLEXITY: Low   GOALS: Goals reviewed with patient? Yes  SHORT TERM GOALS: Target date: 09/06/23  Patient will be independent with initial HEP.  Goal status: INITIAL   LONG TERM GOALS: Target date: 10/11/23  Patient will be independent with advanced/ongoing HEP to improve outcomes and carryover.  Goal status: INITIAL  2.  Patient will report 75% improvement in low back pain to improve QOL.  Baseline: 9/10 Goal status: INITIAL  3.  Patient will demonstrate full pain free lumbar ROM to perform ADLs.   Baseline: see chart Goal status: INITIAL  4.  Patient will tolerate 30 min of (standing/sitting/walking) to perform household chores. Baseline: needs rest breaks while cooking and cleaning d/t pain Goal status: INITIAL  5.  Patient will be able to go up and down stairs without pain Baseline: difficulty with stairs  Goal status: INITIAL  PLAN:  PT FREQUENCY: 2x/week  PT DURATION: 10 weeks  PLANNED INTERVENTIONS: Therapeutic exercises, Therapeutic activity, Neuromuscular re-education, Balance training, Gait training, Patient/Family education, Self Care, Joint mobilization,  Stair training, Dry Needling, Electrical stimulation, Spinal manipulation, Spinal mobilization, Cryotherapy, Moist heat, and Manual therapy.  PLAN FOR NEXT SESSION: resisted gait, more knee strengthening    Cassie Freer, PT 09/06/2023, 11:41 AM

## 2023-09-06 ENCOUNTER — Ambulatory Visit: Payer: Medicaid Other

## 2023-09-06 DIAGNOSIS — M5459 Other low back pain: Secondary | ICD-10-CM

## 2023-09-06 DIAGNOSIS — M6281 Muscle weakness (generalized): Secondary | ICD-10-CM

## 2023-09-06 DIAGNOSIS — M549 Dorsalgia, unspecified: Secondary | ICD-10-CM

## 2023-09-06 DIAGNOSIS — G8929 Other chronic pain: Secondary | ICD-10-CM

## 2023-09-07 NOTE — Therapy (Signed)
OUTPATIENT PHYSICAL THERAPY THORACOLUMBAR TREATMENT   Patient Name: Casey Powell MRN: [id number] DOB:Aug 25, 1956, 67 y.o., female Today's Date: 09/08/2023  END OF SESSION:  PT End of Session - 09/08/23 1058     Visit Number 6    Date for PT Re-Evaluation 10/11/23    Authorization Type [place] Medicaid 4/10    PT Start Time 1100    PT Stop Time 1145    PT Time Calculation (min) 45 min    Activity Tolerance Patient tolerated treatment well    Behavior During Therapy WFL for tasks assessed/performed                 Past Medical History:  Diagnosis Date   Asthma    GERD (gastroesophageal reflux disease)    Hyperlipidemia    Hypertension    Past Surgical History:  Procedure Laterality Date   ABDOMINAL HYSTERECTOMY     TONSILLECTOMY     Patient Active Problem List   Diagnosis Date Noted   DDD (degenerative disc disease), cervical 01/13/2022   DDD (degenerative disc disease), lumbar 01/13/2022   Primary osteoarthritis of both knees 11/30/2021   Chronic right shoulder pain 11/30/2021   DDD (degenerative disc disease), thoracic 11/30/2021    PCP: Gwinda Passe  REFERRING PROVIDER: Rodney Langton  REFERRING DIAG:  M51.36 (ICD-10-CM) - DDD (degenerative disc disease), lumbar    Rationale for Evaluation and Treatment: Rehabilitation  THERAPY DIAG:  Chronic pain of right knee  Other low back pain  Mid back pain, chronic  Muscle weakness (generalized)  ONSET DATE: 07/04/23  SUBJECTIVE:                                                                                                                                                                                           SUBJECTIVE STATEMENT: I am good, the knees always hurt.  Daughter is present at visit to translate  PERTINENT HISTORY:  Known lumbar DDD with a good-sized L3-L4 disc protrusion on MRI  PAIN:  Are you having pain? Yes: NPRS scale: 6/10 Pain location: back, knees Pain  description: back feels like someone broke it, neck and down leg is just tight  Aggravating factors: standing, stress, walking far distances  Relieving factors: pain meds, shots in shoulder and knees   PRECAUTIONS: None  RED FLAGS: None   WEIGHT BEARING RESTRICTIONS: No  FALLS:  Has patient fallen in last 6 months? No  LIVING ENVIRONMENT: Lives with: lives with their family Lives in: House/apartment Stairs: Yes: Internal: 15 steps; on right going up and External: 3 steps; none Has following equipment at home: None  OCCUPATION: Not working  PLOF: Independent  PATIENT GOALS: be able to climb stairs easier, lift up arms better, ease the back pain   NEXT MD VISIT: 08/16/23  OBJECTIVE:   DIAGNOSTIC FINDINGS:  Mild degenerative changes, without spinal canal stenosis or neural foraminal narrowing in the cervical, thoracic, or lumbar spine.  COGNITION: Overall cognitive status: Within functional limits for tasks assessed     SENSATION: WFL  MUSCLE LENGTH: Hamstrings: mild tightness bilaterally   POSTURE: rounded shoulders, forward head, and increased thoracic kyphosis  PALPATION: TTP L4-L5 and SIJ   LUMBAR ROM:   AROM eval  Flexion Able to touch toes  Extension 50%   Right lateral flexion Mid thigh with pain  Left lateral flexion Mid thigh with pain  Right rotation WFL  Left rotation WFL   (Blank rows = not tested)  LOWER EXTREMITY ROM:  WNL   LOWER EXTREMITY MMT:    MMT Right eval Left eval  Hip flexion 5 5  Hip extension    Hip abduction 4 4  Hip adduction 5 5  Hip internal rotation    Hip external rotation    Knee flexion 5 5  Knee extension 4 4  Ankle dorsiflexion    Ankle plantarflexion    Ankle inversion    Ankle eversion     (Blank rows = not tested)  LUMBAR SPECIAL TESTS:  Straight leg raise test: Positive and FABER test: Positive  FUNCTIONAL TESTS:  5 times sit to stand: 12.58s Timed up and go (TUG): 12.35s   TODAY'S TREATMENT:                                                                                                                               DATE:  09/08/23 NuStep L5 x54mins  Resisted gait 20# 4way x4 STS holding yellow ball 2x10 Calf raises 2x12 Leg press 20# 2x10 Passive stretching on LE and low back  Feet on pball rotations, knees to chest, small bridges    09/06/23 Step ups 4" Seated row 20# 2x10 Lat pull downs 20# 2x10  NuStep L5 x23mins  HS curls 20# 2x10  Leg ext 5# x10, 10# x10   STS with WaTe shoulder flexion 3# 2x10 Lateral band steps with green band  Seated HS stretch 15s each side   09/01/23 Calf stretch 15s x2 NuStep L5 x2mins  Shoulder ext 5# 2x10  Box taps 6"  Pec stretch in doorway 30s x2 blackTB ext 2x10  Leg press 20# x10, 30# x10 Calf raises 2x10  STM to upper traps and low back, use of theragun Pball rotations and flexion x10   08/30/23 Bike L1 x2 mins stopped due to knee pain Red band rows and ext 2x10 STS with red ball OHP 2x10 3# LAQ 2x10 HS curls green 2x10 Fitter pushes 2 blue bands  Passive supine stretching HS, glutes, piriformis, trunk rotations, SK2C Feet on pball rotations and knees to chest STM with theragun to the upper traps, rhomboids and the lumbar  area while she was sitting   08/25/23 Nustep level 4 x 5 minutes Red tband HS curls Red tband row Red tband extension Red tband clamshells Ball b/n knees squeeze Feet on ball K2C, trunk rotation, small bridge, iso abs 2.5# supine SAQ STM with theragun to the upper traps, rhomboids and the lumbar area while she was sitting  EVAL 08/02/23    PATIENT EDUCATION:  Education details: POC and HEP Person educated: Patient Education method: Explanation Education comprehension: verbalized understanding  HOME EXERCISE PROGRAM: Access Code: [email or web address] URL: [email or web address] Date: 08/02/2023 Prepared by: Cassie Freer  Exercises - Supine Lower Trunk Rotation  - 1 x daily - 7 x  weekly - 2 sets - 10 reps - Supine Bridge  - 1 x daily - 7 x weekly - 2 sets - 10 reps - Supine Single Knee to Chest Stretch  - 1 x daily - 7 x weekly - 2 reps - 15 hold - Clamshell  - 1 x daily - 7 x weekly - 2 sets - 10 reps - Seated Upper Trapezius Stretch  - 1 x daily - 7 x weekly - 2 reps - 30 hold  ASSESSMENT:  CLINICAL IMPRESSION: Patient is a 67 y.o. female who was seen today for physical therapy treatment for LBP and chronic knee pain. She reports the R knee is worse than her back. Focused on some overall functional strengthening today for both the back and knees. Reports some muscle burning and fatigue with resisted gait. Does really well with all interventions, and can keep progressing each visit. States she feels like all the exercises so far have been really helpful.   OBJECTIVE IMPAIRMENTS: difficulty walking, decreased ROM, decreased strength, impaired flexibility, improper body mechanics, postural dysfunction, and pain.   ACTIVITY LIMITATIONS: standing, squatting, stairs, and locomotion level  REHAB POTENTIAL: Good  CLINICAL DECISION MAKING: Stable/uncomplicated  EVALUATION COMPLEXITY: Low   GOALS: Goals reviewed with patient? Yes  SHORT TERM GOALS: Target date: 09/06/23  Patient will be independent with initial HEP.  Goal status: INITIAL   LONG TERM GOALS: Target date: 10/11/23  Patient will be independent with advanced/ongoing HEP to improve outcomes and carryover.  Goal status: INITIAL  2.  Patient will report 75% improvement in low back pain to improve QOL.  Baseline: 9/10 Goal status: INITIAL  3.  Patient will demonstrate full pain free lumbar ROM to perform ADLs.   Baseline: see chart Goal status: INITIAL  4.  Patient will tolerate 30 min of (standing/sitting/walking) to perform household chores. Baseline: needs rest breaks while cooking and cleaning d/t pain Goal status: INITIAL  5.  Patient will be able to go up and down stairs without  pain Baseline: difficulty with stairs  Goal status: INITIAL  PLAN:  PT FREQUENCY: 2x/week  PT DURATION: 10 weeks  PLANNED INTERVENTIONS: Therapeutic exercises, Therapeutic activity, Neuromuscular re-education, Balance training, Gait training, Patient/Family education, Self Care, Joint mobilization, Stair training, Dry Needling, Electrical stimulation, Spinal manipulation, Spinal mobilization, Cryotherapy, Moist heat, and Manual therapy.  PLAN FOR NEXT SESSION: resisted gait, more knee strengthening    Cassie Freer, PT 09/08/2023, 11:40 AM

## 2023-09-08 ENCOUNTER — Ambulatory Visit: Payer: Medicaid Other

## 2023-09-08 DIAGNOSIS — G8929 Other chronic pain: Secondary | ICD-10-CM

## 2023-09-08 DIAGNOSIS — M5459 Other low back pain: Secondary | ICD-10-CM | POA: Diagnosis not present

## 2023-09-08 DIAGNOSIS — M6281 Muscle weakness (generalized): Secondary | ICD-10-CM

## 2023-09-08 DIAGNOSIS — M549 Dorsalgia, unspecified: Secondary | ICD-10-CM

## 2023-09-14 NOTE — Therapy (Signed)
OUTPATIENT PHYSICAL THERAPY THORACOLUMBAR TREATMENT   Patient Name: Casey Powell MRN: [id number] DOB:March 11, 1956, 67 y.o., female Today's Date: 09/15/2023  END OF SESSION:  PT End of Session - 09/15/23 1104     Visit Number 7    Date for PT Re-Evaluation 10/11/23    Authorization Type [place] [employer] 6/10    PT Start Time 1100    PT Stop Time 1145    PT Time Calculation (min) 45 min    Activity Tolerance Patient tolerated treatment well    Behavior During Therapy WFL for tasks assessed/performed                  Past Medical History:  Diagnosis Date   Asthma    GERD (gastroesophageal reflux disease)    Hyperlipidemia    Hypertension    Past Surgical History:  Procedure Laterality Date   ABDOMINAL HYSTERECTOMY     TONSILLECTOMY     Patient Active Problem List   Diagnosis Date Noted   DDD (degenerative disc disease), cervical 01/13/2022   DDD (degenerative disc disease), lumbar 01/13/2022   Primary osteoarthritis of both knees 11/30/2021   Chronic right shoulder pain 11/30/2021   DDD (degenerative disc disease), thoracic 11/30/2021    PCP: Gwinda Passe  REFERRING PROVIDER: Rodney Langton  REFERRING DIAG:  M51.36 (ICD-10-CM) - DDD (degenerative disc disease), lumbar    Rationale for Evaluation and Treatment: Rehabilitation  THERAPY DIAG:  Chronic pain of right knee  Chronic pain of left knee  Muscle weakness (generalized)  Other symptoms and signs involving the musculoskeletal system  ONSET DATE: 07/04/23  SUBJECTIVE:                                                                                                                                                                                           SUBJECTIVE STATEMENT: Knees and feet hurting.  Daughter is present at visit to translate  PERTINENT HISTORY:  Known lumbar DDD with a good-sized L3-L4 disc protrusion on MRI  PAIN:  Are you having pain? Yes: NPRS scale: 6/10 Pain  location: back, knees Pain description: back feels like someone broke it, neck and down leg is just tight  Aggravating factors: standing, stress, walking far distances  Relieving factors: pain meds, shots in shoulder and knees   PRECAUTIONS: None  RED FLAGS: None   WEIGHT BEARING RESTRICTIONS: No  FALLS:  Has patient fallen in last 6 months? No  LIVING ENVIRONMENT: Lives with: lives with their family Lives in: House/apartment Stairs: Yes: Internal: 15 steps; on right going up and External: 3 steps; none Has following equipment at home: None  OCCUPATION: Not working  PLOF: Independent  PATIENT GOALS: be able to climb stairs easier, lift up arms better, ease the back pain   NEXT MD VISIT: 08/16/23  OBJECTIVE:   DIAGNOSTIC FINDINGS:  Mild degenerative changes, without spinal canal stenosis or neural foraminal narrowing in the cervical, thoracic, or lumbar spine.  COGNITION: Overall cognitive status: Within functional limits for tasks assessed     SENSATION: WFL  MUSCLE LENGTH: Hamstrings: mild tightness bilaterally   POSTURE: rounded shoulders, forward head, and increased thoracic kyphosis  PALPATION: TTP L4-L5 and SIJ   LUMBAR ROM:   AROM eval  Flexion Able to touch toes  Extension 50%   Right lateral flexion Mid thigh with pain  Left lateral flexion Mid thigh with pain  Right rotation WFL  Left rotation WFL   (Blank rows = not tested)  LOWER EXTREMITY ROM:  WNL   LOWER EXTREMITY MMT:    MMT Right eval Left eval  Hip flexion 5 5  Hip extension    Hip abduction 4 4  Hip adduction 5 5  Hip internal rotation    Hip external rotation    Knee flexion 5 5  Knee extension 4 4  Ankle dorsiflexion    Ankle plantarflexion    Ankle inversion    Ankle eversion     (Blank rows = not tested)  LUMBAR SPECIAL TESTS:  Straight leg raise test: Positive and FABER test: Positive  FUNCTIONAL TESTS:  5 times sit to stand: 12.58s Timed up and go (TUG):  12.35s   TODAY'S TREATMENT:                                                                                                                              DATE:  09/15/23 Step ups 6" Lateral step ups 6" Box taps from airex  Calf stretch 15s x2 on slant NuStep L5x44mins  Leg ext 10# 2x10 HS curls 20# 2x10 Walking on beam  STS on airex 2x10   09/08/23 NuStep L5 x6mins  Resisted gait 20# 4way x4 STS holding yellow ball 2x10 Calf raises 2x12 Leg press 20# 2x10 Passive stretching on LE and low back  Feet on pball rotations, knees to chest, small bridges    09/06/23 Step ups 4" Seated row 20# 2x10 Lat pull downs 20# 2x10  NuStep L5 x30mins  HS curls 20# 2x10  Leg ext 5# x10, 10# x10   STS with WaTe shoulder flexion 3# 2x10 Lateral band steps with green band  Seated HS stretch 15s each side   09/01/23 Calf stretch 15s x2 NuStep L5 x38mins  Shoulder ext 5# 2x10  Box taps 6"  Pec stretch in doorway 30s x2 blackTB ext 2x10  Leg press 20# x10, 30# x10 Calf raises 2x10  STM to upper traps and low back, use of theragun Pball rotations and flexion x10   08/30/23 Bike L1 x2 mins stopped due to knee pain Red band rows and ext 2x10 STS with red ball  OHP 2x10 3# LAQ 2x10 HS curls green 2x10 Fitter pushes 2 blue bands  Passive supine stretching HS, glutes, piriformis, trunk rotations, SK2C Feet on pball rotations and knees to chest STM with theragun to the upper traps, rhomboids and the lumbar area while she was sitting   08/25/23 Nustep level 4 x 5 minutes Red tband HS curls Red tband row Red tband extension Red tband clamshells Ball b/n knees squeeze Feet on ball K2C, trunk rotation, small bridge, iso abs 2.5# supine SAQ STM with theragun to the upper traps, rhomboids and the lumbar area while she was sitting  EVAL 08/02/23    PATIENT EDUCATION:  Education details: POC and HEP Person educated: Patient Education method: Explanation Education comprehension:  verbalized understanding  HOME EXERCISE PROGRAM: Access Code: [email or web address] URL: [email or web address] Date: 08/02/2023 Prepared by: Cassie Freer  Exercises - Supine Lower Trunk Rotation  - 1 x daily - 7 x weekly - 2 sets - 10 reps - Supine Bridge  - 1 x daily - 7 x weekly - 2 sets - 10 reps - Supine Single Knee to Chest Stretch  - 1 x daily - 7 x weekly - 2 reps - 15 hold - Clamshell  - 1 x daily - 7 x weekly - 2 sets - 10 reps - Seated Upper Trapezius Stretch  - 1 x daily - 7 x weekly - 2 reps - 30 hold  ASSESSMENT:  CLINICAL IMPRESSION: Patient is a 67 y.o. female who was seen today for physical therapy treatment for chronic knee pain. She reports the back is okay but her knees always hurt. Some muscle fatigue with strengthening exercises. Added in some balance training today. Does well with all interventions, and can keep progressing each visit. States she feels like all the exercises so far have been really helpful.   OBJECTIVE IMPAIRMENTS: difficulty walking, decreased ROM, decreased strength, impaired flexibility, improper body mechanics, postural dysfunction, and pain.   ACTIVITY LIMITATIONS: standing, squatting, stairs, and locomotion level  REHAB POTENTIAL: Good  CLINICAL DECISION MAKING: Stable/uncomplicated  EVALUATION COMPLEXITY: Low   GOALS: Goals reviewed with patient? Yes  SHORT TERM GOALS: Target date: 09/06/23  Patient will be independent with initial HEP.  Goal status: INITIAL   LONG TERM GOALS: Target date: 10/11/23  Patient will be independent with advanced/ongoing HEP to improve outcomes and carryover.  Goal status: INITIAL  2.  Patient will report 75% improvement in low back pain to improve QOL.  Baseline: 9/10 Goal status: INITIAL  3.  Patient will demonstrate full pain free lumbar ROM to perform ADLs.   Baseline: see chart Goal status: INITIAL  4.  Patient will tolerate 30 min of (standing/sitting/walking) to perform household  chores. Baseline: needs rest breaks while cooking and cleaning d/t pain Goal status: INITIAL  5.  Patient will be able to go up and down stairs without pain Baseline: difficulty with stairs  Goal status: INITIAL  PLAN:  PT FREQUENCY: 2x/week  PT DURATION: 10 weeks  PLANNED INTERVENTIONS: Therapeutic exercises, Therapeutic activity, Neuromuscular re-education, Balance training, Gait training, Patient/Family education, Self Care, Joint mobilization, Stair training, Dry Needling, Electrical stimulation, Spinal manipulation, Spinal mobilization, Cryotherapy, Moist heat, and Manual therapy.  PLAN FOR NEXT SESSION: resisted gait, more knee strengthening    Cassie Freer, PT 09/15/2023, 11:40 AM

## 2023-09-15 ENCOUNTER — Ambulatory Visit: Payer: Medicaid Other

## 2023-09-15 DIAGNOSIS — M5459 Other low back pain: Secondary | ICD-10-CM | POA: Diagnosis not present

## 2023-09-15 DIAGNOSIS — R29898 Other symptoms and signs involving the musculoskeletal system: Secondary | ICD-10-CM

## 2023-09-15 DIAGNOSIS — G8929 Other chronic pain: Secondary | ICD-10-CM

## 2023-09-15 DIAGNOSIS — M6281 Muscle weakness (generalized): Secondary | ICD-10-CM

## 2023-09-19 NOTE — Therapy (Signed)
OUTPATIENT PHYSICAL THERAPY THORACOLUMBAR TREATMENT   Patient Name: Casey Powell MRN: [id number] DOB:1956/03/06, 67 y.o., female Today's Date: 09/20/2023  END OF SESSION:  PT End of Session - 09/20/23 1101     Visit Number 8    Date for PT Re-Evaluation 10/11/23    Authorization Type [place] Medicaid 7/10    PT Start Time 1100    PT Stop Time 1145    PT Time Calculation (min) 45 min    Activity Tolerance Patient tolerated treatment well    Behavior During Therapy WFL for tasks assessed/performed                   Past Medical History:  Diagnosis Date   Asthma    GERD (gastroesophageal reflux disease)    Hyperlipidemia    Hypertension    Past Surgical History:  Procedure Laterality Date   ABDOMINAL HYSTERECTOMY     TONSILLECTOMY     Patient Active Problem List   Diagnosis Date Noted   DDD (degenerative disc disease), cervical 01/13/2022   DDD (degenerative disc disease), lumbar 01/13/2022   Primary osteoarthritis of both knees 11/30/2021   Chronic right shoulder pain 11/30/2021   DDD (degenerative disc disease), thoracic 11/30/2021    PCP: Gwinda Passe  REFERRING PROVIDER: Rodney Langton  REFERRING DIAG:  M51.36 (ICD-10-CM) - DDD (degenerative disc disease), lumbar    Rationale for Evaluation and Treatment: Rehabilitation  THERAPY DIAG:  Chronic pain of right knee  Chronic pain of left knee  Muscle weakness (generalized)  Other symptoms and signs involving the musculoskeletal system  Other low back pain  ONSET DATE: 07/04/23  SUBJECTIVE:                                                                                                                                                                                           SUBJECTIVE STATEMENT: Knees are hurting a little bit but I am good. The back is okay.   Daughter is present at visit to translate  PERTINENT HISTORY:  Known lumbar DDD with a good-sized L3-L4 disc protrusion on  MRI  PAIN:  Are you having pain? Yes: NPRS scale: 4/10 Pain location: back, knees Pain description: back feels like someone broke it, neck and down leg is just tight  Aggravating factors: standing, stress, walking far distances  Relieving factors: pain meds, shots in shoulder and knees   PRECAUTIONS: None  RED FLAGS: None   WEIGHT BEARING RESTRICTIONS: No  FALLS:  Has patient fallen in last 6 months? No  LIVING ENVIRONMENT: Lives with: lives with their family Lives in: House/apartment Stairs: Yes: Internal: 15 steps; on right  going up and External: 3 steps; none Has following equipment at home: None  OCCUPATION: Not working  PLOF: Independent  PATIENT GOALS: be able to climb stairs easier, lift up arms better, ease the back pain   NEXT MD VISIT: 08/16/23  OBJECTIVE:   DIAGNOSTIC FINDINGS:  Mild degenerative changes, without spinal canal stenosis or neural foraminal narrowing in the cervical, thoracic, or lumbar spine.  COGNITION: Overall cognitive status: Within functional limits for tasks assessed     SENSATION: WFL  MUSCLE LENGTH: Hamstrings: mild tightness bilaterally   POSTURE: rounded shoulders, forward head, and increased thoracic kyphosis  PALPATION: TTP L4-L5 and SIJ   LUMBAR ROM:   AROM eval  Flexion Able to touch toes  Extension 50%   Right lateral flexion Mid thigh with pain  Left lateral flexion Mid thigh with pain  Right rotation WFL  Left rotation WFL   (Blank rows = not tested)  LOWER EXTREMITY ROM:  WNL   LOWER EXTREMITY MMT:    MMT Right eval Left eval  Hip flexion 5 5  Hip extension    Hip abduction 4 4  Hip adduction 5 5  Hip internal rotation    Hip external rotation    Knee flexion 5 5  Knee extension 4 4  Ankle dorsiflexion    Ankle plantarflexion    Ankle inversion    Ankle eversion     (Blank rows = not tested)  LUMBAR SPECIAL TESTS:  Straight leg raise test: Positive and FABER test: Positive  FUNCTIONAL  TESTS:  5 times sit to stand: 12.58s Timed up and go (TUG): 12.35s   TODAY'S TREATMENT:                                                                                                                              DATE:  09/20/23 Walking outdoors around [employer] L5x74mins  Resisted side steps 20# x5 each side  STS with chest press yellow 2x10 2# hip abd/ext 2x10 2# marching 2x10 Leg press 20# 2x10  blackTB flexion 2x10   09/15/23 Step ups 6" Lateral step ups 6" Box taps from airex  Calf stretch 15s x2 on slant NuStep L5x77mins  Leg ext 10# 2x10 HS curls 20# 2x10 Walking on beam  STS on airex 2x10   09/08/23 NuStep L5 x73mins  Resisted gait 20# 4way x4 STS holding yellow ball 2x10 Calf raises 2x12 Leg press 20# 2x10 Passive stretching on LE and low back  Feet on pball rotations, knees to chest, small bridges    09/06/23 Step ups 4" Seated row 20# 2x10 Lat pull downs 20# 2x10  NuStep L5 x54mins  HS curls 20# 2x10  Leg ext 5# x10, 10# x10   STS with WaTe shoulder flexion 3# 2x10 Lateral band steps with green band  Seated HS stretch 15s each side   09/01/23 Calf stretch 15s x2 NuStep L5 x14mins  Shoulder ext 5# 2x10  Box taps  6"  Pec stretch in doorway 30s x2 blackTB ext 2x10  Leg press 20# x10, 30# x10 Calf raises 2x10  STM to upper traps and low back, use of theragun Pball rotations and flexion x10   08/30/23 Bike L1 x2 mins stopped due to knee pain Red band rows and ext 2x10 STS with red ball OHP 2x10 3# LAQ 2x10 HS curls green 2x10 Fitter pushes 2 blue bands  Passive supine stretching HS, glutes, piriformis, trunk rotations, SK2C Feet on pball rotations and knees to chest STM with theragun to the upper traps, rhomboids and the lumbar area while she was sitting   08/25/23 Nustep level 4 x 5 minutes Red tband HS curls Red tband row Red tband extension Red tband clamshells Ball b/n knees squeeze Feet on ball K2C, trunk rotation,  small bridge, iso abs 2.5# supine SAQ STM with theragun to the upper traps, rhomboids and the lumbar area while she was sitting  EVAL 08/02/23    PATIENT EDUCATION:  Education details: POC and HEP Person educated: Patient Education method: Explanation Education comprehension: verbalized understanding  HOME EXERCISE PROGRAM: Access Code: [email or web address] URL: [email or web address] Date: 08/02/2023 Prepared by: Cassie Freer  Exercises - Supine Lower Trunk Rotation  - 1 x daily - 7 x weekly - 2 sets - 10 reps - Supine Bridge  - 1 x daily - 7 x weekly - 2 sets - 10 reps - Supine Single Knee to Chest Stretch  - 1 x daily - 7 x weekly - 2 reps - 15 hold - Clamshell  - 1 x daily - 7 x weekly - 2 sets - 10 reps - Seated Upper Trapezius Stretch  - 1 x daily - 7 x weekly - 2 reps - 30 hold  ASSESSMENT:  CLINICAL IMPRESSION: Patient is a 67 y.o. female who was seen today for physical therapy treatment for chronic knee pain. She reports the back is okay today. Continued to work on strengthening today especially in lower extremities. Does well with all interventions, and can keep progressing each visit. Ended with core exercise using black theraband, she reports this is hard but is able to do 2 sets.   OBJECTIVE IMPAIRMENTS: difficulty walking, decreased ROM, decreased strength, impaired flexibility, improper body mechanics, postural dysfunction, and pain.   ACTIVITY LIMITATIONS: standing, squatting, stairs, and locomotion level  REHAB POTENTIAL: Good  CLINICAL DECISION MAKING: Stable/uncomplicated  EVALUATION COMPLEXITY: Low   GOALS: Goals reviewed with patient? Yes  SHORT TERM GOALS: Target date: 09/06/23  Patient will be independent with initial HEP.  Goal status: INITIAL   LONG TERM GOALS: Target date: 10/11/23  Patient will be independent with advanced/ongoing HEP to improve outcomes and carryover.  Goal status: INITIAL  2.  Patient will report 75% improvement in  low back pain to improve QOL.  Baseline: 9/10 Goal status: INITIAL  3.  Patient will demonstrate full pain free lumbar ROM to perform ADLs.   Baseline: see chart Goal status: INITIAL  4.  Patient will tolerate 30 min of (standing/sitting/walking) to perform household chores. Baseline: needs rest breaks while cooking and cleaning d/t pain Goal status: INITIAL  5.  Patient will be able to go up and down stairs without pain Baseline: difficulty with stairs  Goal status: INITIAL  PLAN:  PT FREQUENCY: 2x/week  PT DURATION: 10 weeks  PLANNED INTERVENTIONS: Therapeutic exercises, Therapeutic activity, Neuromuscular re-education, Balance training, Gait training, Patient/Family education, Self Care, Joint mobilization, Stair training, Dry Needling, Electrical stimulation, Spinal manipulation, Spinal mobilization, Cryotherapy,  Moist heat, and Manual therapy.  PLAN FOR NEXT SESSION: resisted gait, more knee strengthening    Cassie Freer, PT 09/20/2023, 11:41 AM

## 2023-09-20 ENCOUNTER — Ambulatory Visit: Payer: Medicaid Other | Attending: Sports Medicine

## 2023-09-20 DIAGNOSIS — R29898 Other symptoms and signs involving the musculoskeletal system: Secondary | ICD-10-CM | POA: Diagnosis present

## 2023-09-20 DIAGNOSIS — M5459 Other low back pain: Secondary | ICD-10-CM | POA: Insufficient documentation

## 2023-09-20 DIAGNOSIS — M6281 Muscle weakness (generalized): Secondary | ICD-10-CM | POA: Diagnosis present

## 2023-09-20 DIAGNOSIS — M549 Dorsalgia, unspecified: Secondary | ICD-10-CM | POA: Diagnosis present

## 2023-09-20 DIAGNOSIS — M25562 Pain in left knee: Secondary | ICD-10-CM | POA: Insufficient documentation

## 2023-09-20 DIAGNOSIS — G8929 Other chronic pain: Secondary | ICD-10-CM | POA: Diagnosis present

## 2023-09-20 DIAGNOSIS — M25561 Pain in right knee: Secondary | ICD-10-CM | POA: Diagnosis present

## 2023-09-20 NOTE — Therapy (Signed)
OUTPATIENT PHYSICAL THERAPY THORACOLUMBAR TREATMENT   Patient Name: Casey Powell MRN: [id number] DOB:December 21, 1955, 67 y.o., female Today's Date: 09/22/2023  END OF SESSION:  PT End of Session - 09/22/23 1316     Visit Number 9    Date for PT Re-Evaluation 10/11/23    Authorization Type [place] Medicaid 7/10    PT Start Time 1315    PT Stop Time 1400    PT Time Calculation (min) 45 min    Activity Tolerance Patient tolerated treatment well    Behavior During Therapy WFL for tasks assessed/performed                    Past Medical History:  Diagnosis Date   Asthma    GERD (gastroesophageal reflux disease)    Hyperlipidemia    Hypertension    Past Surgical History:  Procedure Laterality Date   ABDOMINAL HYSTERECTOMY     TONSILLECTOMY     Patient Active Problem List   Diagnosis Date Noted   DDD (degenerative disc disease), cervical 01/13/2022   DDD (degenerative disc disease), lumbar 01/13/2022   Primary osteoarthritis of both knees 11/30/2021   Chronic right shoulder pain 11/30/2021   DDD (degenerative disc disease), thoracic 11/30/2021    PCP: Gwinda Passe  REFERRING PROVIDER: Rodney Langton  REFERRING DIAG:  M51.36 (ICD-10-CM) - DDD (degenerative disc disease), lumbar    Rationale for Evaluation and Treatment: Rehabilitation  THERAPY DIAG:  Chronic pain of right knee  Chronic pain of left knee  Muscle weakness (generalized)  Other symptoms and signs involving the musculoskeletal system  Other low back pain  Mid back pain, chronic  ONSET DATE: 07/04/23  SUBJECTIVE:                                                                                                                                                                                           SUBJECTIVE STATEMENT: Good but my knee is hurting when I bend it and I don't want to squat because that hurts.   Daughter is present at visit to translate  PERTINENT HISTORY:  Known  lumbar DDD with a good-sized L3-L4 disc protrusion on MRI  PAIN:  Are you having pain? Yes: NPRS scale: 4/10 Pain location: back, knees Pain description: back feels like someone broke it, neck and down leg is just tight  Aggravating factors: standing, stress, walking far distances  Relieving factors: pain meds, shots in shoulder and knees   PRECAUTIONS: None  RED FLAGS: None   WEIGHT BEARING RESTRICTIONS: No  FALLS:  Has patient fallen in last 6 months? No  LIVING ENVIRONMENT: Lives with: lives with their  family Lives in: House/apartment Stairs: Yes: Internal: 15 steps; on right going up and External: 3 steps; none Has following equipment at home: None  OCCUPATION: Not working  PLOF: Independent  PATIENT GOALS: be able to climb stairs easier, lift up arms better, ease the back pain   NEXT MD VISIT: 08/16/23  OBJECTIVE:   DIAGNOSTIC FINDINGS:  Mild degenerative changes, without spinal canal stenosis or neural foraminal narrowing in the cervical, thoracic, or lumbar spine.  COGNITION: Overall cognitive status: Within functional limits for tasks assessed     SENSATION: WFL  MUSCLE LENGTH: Hamstrings: mild tightness bilaterally   POSTURE: rounded shoulders, forward head, and increased thoracic kyphosis  PALPATION: TTP L4-L5 and SIJ   LUMBAR ROM:   AROM eval  Flexion Able to touch toes  Extension 50%   Right lateral flexion Mid thigh with pain  Left lateral flexion Mid thigh with pain  Right rotation WFL  Left rotation WFL   (Blank rows = not tested)  LOWER EXTREMITY ROM:  WNL   LOWER EXTREMITY MMT:    MMT Right eval Left eval  Hip flexion 5 5  Hip extension    Hip abduction 4 4  Hip adduction 5 5  Hip internal rotation    Hip external rotation    Knee flexion 5 5  Knee extension 4 4  Ankle dorsiflexion    Ankle plantarflexion    Ankle inversion    Ankle eversion     (Blank rows = not tested)  LUMBAR SPECIAL TESTS:  Straight leg raise  test: Positive and FABER test: Positive  FUNCTIONAL TESTS:  5 times sit to stand: 12.58s Timed up and go (TUG): 12.35s   TODAY'S TREATMENT:                                                                                                                              DATE:  09/22/23 NuStep L5x15mins  Leg ext 10# 2x10 HS curls 20# 2x10 Calf raises 2x10 Seated row 20# 2x10 Seated hip abduction green 2x10 Ball squeeze with LAQ 2x10  2# ankle stepping over obstacles   09/20/23 Walking outdoors around [employer]  NuStep L5x56mins  Resisted side steps 20# x5 each side  STS with chest press yellow 2x10 2# hip abd/ext 2x10 2# marching 2x10 Leg press 20# 2x10  blackTB flexion 2x10   09/15/23 Step ups 6" Lateral step ups 6" Box taps from airex  Calf stretch 15s x2 on slant NuStep L5x75mins  Leg ext 10# 2x10 HS curls 20# 2x10 Walking on beam  STS on airex 2x10   09/08/23 NuStep L5 x108mins  Resisted gait 20# 4way x4 STS holding yellow ball 2x10 Calf raises 2x12 Leg press 20# 2x10 Passive stretching on LE and low back  Feet on pball rotations, knees to chest, small bridges    09/06/23 Step ups 4" Seated row 20# 2x10 Lat pull downs 20# 2x10  NuStep L5 x66mins  HS curls 20#  2x10  Leg ext 5# x10, 10# x10   STS with WaTe shoulder flexion 3# 2x10 Lateral band steps with green band  Seated HS stretch 15s each side   09/01/23 Calf stretch 15s x2 NuStep L5 x74mins  Shoulder ext 5# 2x10  Box taps 6"  Pec stretch in doorway 30s x2 blackTB ext 2x10  Leg press 20# x10, 30# x10 Calf raises 2x10  STM to upper traps and low back, use of theragun Pball rotations and flexion x10   08/30/23 Bike L1 x2 mins stopped due to knee pain Red band rows and ext 2x10 STS with red ball OHP 2x10 3# LAQ 2x10 HS curls green 2x10 Fitter pushes 2 blue bands  Passive supine stretching HS, glutes, piriformis, trunk rotations, SK2C Feet on pball rotations and knees to chest STM with  theragun to the upper traps, rhomboids and the lumbar area while she was sitting   08/25/23 Nustep level 4 x 5 minutes Red tband HS curls Red tband row Red tband extension Red tband clamshells Ball b/n knees squeeze Feet on ball K2C, trunk rotation, small bridge, iso abs 2.5# supine SAQ STM with theragun to the upper traps, rhomboids and the lumbar area while she was sitting  EVAL 08/02/23    PATIENT EDUCATION:  Education details: POC and HEP Person educated: Patient Education method: Explanation Education comprehension: verbalized understanding  HOME EXERCISE PROGRAM: Access Code: [email or web address] URL: [email or web address] Date: 08/02/2023 Prepared by: Cassie Freer  Exercises - Supine Lower Trunk Rotation  - 1 x daily - 7 x weekly - 2 sets - 10 reps - Supine Bridge  - 1 x daily - 7 x weekly - 2 sets - 10 reps - Supine Single Knee to Chest Stretch  - 1 x daily - 7 x weekly - 2 reps - 15 hold - Clamshell  - 1 x daily - 7 x weekly - 2 sets - 10 reps - Seated Upper Trapezius Stretch  - 1 x daily - 7 x weekly - 2 reps - 30 hold  ASSESSMENT:  CLINICAL IMPRESSION: Patient is a 67 y.o. female who was seen today for physical therapy treatment for chronic knee pain. She reports the back is okay today. Continued to work on strengthening today especially in lower extremities and for knees. Does well with all interventions with no increase in pain, can keep progressing each visit.   OBJECTIVE IMPAIRMENTS: difficulty walking, decreased ROM, decreased strength, impaired flexibility, improper body mechanics, postural dysfunction, and pain.   ACTIVITY LIMITATIONS: standing, squatting, stairs, and locomotion level  REHAB POTENTIAL: Good  CLINICAL DECISION MAKING: Stable/uncomplicated  EVALUATION COMPLEXITY: Low   GOALS: Goals reviewed with patient? Yes  SHORT TERM GOALS: Target date: 09/06/23  Patient will be independent with initial HEP.  Goal status: INITIAL   LONG  TERM GOALS: Target date: 10/11/23  Patient will be independent with advanced/ongoing HEP to improve outcomes and carryover.  Goal status: INITIAL  2.  Patient will report 75% improvement in low back pain to improve QOL.  Baseline: 9/10 Goal status: INITIAL  3.  Patient will demonstrate full pain free lumbar ROM to perform ADLs.   Baseline: see chart Goal status: INITIAL  4.  Patient will tolerate 30 min of (standing/sitting/walking) to perform household chores. Baseline: needs rest breaks while cooking and cleaning d/t pain Goal status: INITIAL  5.  Patient will be able to go up and down stairs without pain Baseline: difficulty with stairs  Goal status: INITIAL  PLAN:  PT FREQUENCY:  2x/week  PT DURATION: 10 weeks  PLANNED INTERVENTIONS: Therapeutic exercises, Therapeutic activity, Neuromuscular re-education, Balance training, Gait training, Patient/Family education, Self Care, Joint mobilization, Stair training, Dry Needling, Electrical stimulation, Spinal manipulation, Spinal mobilization, Cryotherapy, Moist heat, and Manual therapy.  PLAN FOR NEXT SESSION: resisted gait, more knee strengthening    Cassie Freer, PT 09/22/2023, 1:55 PM

## 2023-09-22 ENCOUNTER — Ambulatory Visit: Payer: Medicaid Other

## 2023-09-22 DIAGNOSIS — M25561 Pain in right knee: Secondary | ICD-10-CM | POA: Diagnosis not present

## 2023-09-22 DIAGNOSIS — G8929 Other chronic pain: Secondary | ICD-10-CM

## 2023-09-22 DIAGNOSIS — M549 Dorsalgia, unspecified: Secondary | ICD-10-CM

## 2023-09-22 DIAGNOSIS — M5459 Other low back pain: Secondary | ICD-10-CM

## 2023-09-22 DIAGNOSIS — M6281 Muscle weakness (generalized): Secondary | ICD-10-CM

## 2023-09-22 DIAGNOSIS — R29898 Other symptoms and signs involving the musculoskeletal system: Secondary | ICD-10-CM

## 2023-09-26 NOTE — Therapy (Signed)
OUTPATIENT PHYSICAL THERAPY THORACOLUMBAR TREATMENT Progress Note Reporting Period 08/02/23 to 09/27/23  See note below for Objective Data and Assessment of Progress/Goals.      Patient Name: Casey Powell MRN: [id number] DOB:11-09-1956, 67 y.o., female Today's Date: 09/27/2023  END OF SESSION:  PT End of Session - 09/27/23 1101     Visit Number 10    Date for PT Re-Evaluation 10/11/23    Authorization Type [place] Medicaid 9/10    PT Start Time 1100    PT Stop Time 1145    PT Time Calculation (min) 45 min    Activity Tolerance Patient tolerated treatment well    Behavior During Therapy WFL for tasks assessed/performed                     Past Medical History:  Diagnosis Date   Asthma    GERD (gastroesophageal reflux disease)    Hyperlipidemia    Hypertension    Past Surgical History:  Procedure Laterality Date   ABDOMINAL HYSTERECTOMY     TONSILLECTOMY     Patient Active Problem List   Diagnosis Date Noted   DDD (degenerative disc disease), cervical 01/13/2022   DDD (degenerative disc disease), lumbar 01/13/2022   Primary osteoarthritis of both knees 11/30/2021   Chronic right shoulder pain 11/30/2021   DDD (degenerative disc disease), thoracic 11/30/2021    PCP: Gwinda Passe  REFERRING PROVIDER: Rodney Langton  REFERRING DIAG:  M51.36 (ICD-10-CM) - DDD (degenerative disc disease), lumbar    Rationale for Evaluation and Treatment: Rehabilitation  THERAPY DIAG:  Chronic pain of right knee  Muscle weakness (generalized)  Other symptoms and signs involving the musculoskeletal system  Other low back pain  ONSET DATE: 07/04/23  SUBJECTIVE:                                                                                                                                                                                           SUBJECTIVE STATEMENT: Good but my knee is hurting when I bend it and I don't want to squat because that hurts.    Daughter is present at visit to translate  PERTINENT HISTORY:  Known lumbar DDD with a good-sized L3-L4 disc protrusion on MRI  PAIN:  Are you having pain? Yes: NPRS scale: 4/10 Pain location: back, knees Pain description: back feels like someone broke it, neck and down leg is just tight  Aggravating factors: standing, stress, walking far distances  Relieving factors: pain meds, shots in shoulder and knees   PRECAUTIONS: None  RED FLAGS: None   WEIGHT BEARING RESTRICTIONS: No  FALLS:  Has patient fallen in last  6 months? No  LIVING ENVIRONMENT: Lives with: lives with their family Lives in: House/apartment Stairs: Yes: Internal: 15 steps; on right going up and External: 3 steps; none Has following equipment at home: None  OCCUPATION: Not working  PLOF: Independent  PATIENT GOALS: be able to climb stairs easier, lift up arms better, ease the back pain   NEXT MD VISIT: 08/16/23  OBJECTIVE:   DIAGNOSTIC FINDINGS:  Mild degenerative changes, without spinal canal stenosis or neural foraminal narrowing in the cervical, thoracic, or lumbar spine.  COGNITION: Overall cognitive status: Within functional limits for tasks assessed     SENSATION: WFL  MUSCLE LENGTH: Hamstrings: mild tightness bilaterally   POSTURE: rounded shoulders, forward head, and increased thoracic kyphosis  PALPATION: TTP L4-L5 and SIJ   LUMBAR ROM:   AROM eval 09/27/23  Flexion Able to touch toes   Extension 50%  WFL  Right lateral flexion Mid thigh with pain WFL  Left lateral flexion Mid thigh with pain WFL  Right rotation WFL   Left rotation WFL    (Blank rows = not tested)  LOWER EXTREMITY ROM:  WNL   LOWER EXTREMITY MMT:    MMT Right eval Left eval  Hip flexion 5 5  Hip extension    Hip abduction 4 4  Hip adduction 5 5  Hip internal rotation    Hip external rotation    Knee flexion 5 5  Knee extension 4 4  Ankle dorsiflexion    Ankle plantarflexion    Ankle  inversion    Ankle eversion     (Blank rows = not tested)  LUMBAR SPECIAL TESTS:  Straight leg raise test: Positive and FABER test: Positive  FUNCTIONAL TESTS:  5 times sit to stand: 12.58s Timed up and go (TUG): 12.35s   TODAY'S TREATMENT:                                                                                                                              DATE:  09/27/23 Progress note 10th  NuStep L5x35min Lateral step ups 6" Calf stretch 15s x2  Calf raises 2x10 Leg press 20# 2x10 STS with chest press 2x10 HS stretch 30s x2   09/22/23 NuStep L5x65mins  Leg ext 10# 2x10 HS curls 20# 2x10 Calf raises 2x10 Seated row 20# 2x10 Seated hip abduction green 2x10 Ball squeeze with LAQ 2x10 2# ankle stepping over obstacles   09/20/23 Walking outdoors around [employer]  NuStep L5x9mins  Resisted side steps 20# x5 each side  STS with chest press yellow 2x10 2# hip abd/ext 2x10 2# marching 2x10 Leg press 20# 2x10  blackTB flexion 2x10   09/15/23 Step ups 6" Lateral step ups 6" Box taps from airex  Calf stretch 15s x2 on slant NuStep L5x30mins  Leg ext 10# 2x10 HS curls 20# 2x10 Walking on beam  STS on airex 2x10   09/08/23 NuStep L5 x56mins  Resisted gait 20# 4way x4 STS holding  yellow ball 2x10 Calf raises 2x12 Leg press 20# 2x10 Passive stretching on LE and low back  Feet on pball rotations, knees to chest, small bridges    09/06/23 Step ups 4" Seated row 20# 2x10 Lat pull downs 20# 2x10  NuStep L5 x62mins  HS curls 20# 2x10  Leg ext 5# x10, 10# x10   STS with WaTe shoulder flexion 3# 2x10 Lateral band steps with green band  Seated HS stretch 15s each side   09/01/23 Calf stretch 15s x2 NuStep L5 x43mins  Shoulder ext 5# 2x10  Box taps 6"  Pec stretch in doorway 30s x2 blackTB ext 2x10  Leg press 20# x10, 30# x10 Calf raises 2x10  STM to upper traps and low back, use of theragun Pball rotations and flexion x10   08/30/23 Bike L1 x2  mins stopped due to knee pain Red band rows and ext 2x10 STS with red ball OHP 2x10 3# LAQ 2x10 HS curls green 2x10 Fitter pushes 2 blue bands  Passive supine stretching HS, glutes, piriformis, trunk rotations, SK2C Feet on pball rotations and knees to chest STM with theragun to the upper traps, rhomboids and the lumbar area while she was sitting   08/25/23 Nustep level 4 x 5 minutes Red tband HS curls Red tband row Red tband extension Red tband clamshells Ball b/n knees squeeze Feet on ball K2C, trunk rotation, small bridge, iso abs 2.5# supine SAQ STM with theragun to the upper traps, rhomboids and the lumbar area while she was sitting  EVAL 08/02/23    PATIENT EDUCATION:  Education details: POC and HEP Person educated: Patient Education method: Explanation Education comprehension: verbalized understanding  HOME EXERCISE PROGRAM: Access Code: [email or web address] URL: [email or web address] Date: 08/02/2023 Prepared by: Cassie Freer  Exercises - Supine Lower Trunk Rotation  - 1 x daily - 7 x weekly - 2 sets - 10 reps - Supine Bridge  - 1 x daily - 7 x weekly - 2 sets - 10 reps - Supine Single Knee to Chest Stretch  - 1 x daily - 7 x weekly - 2 reps - 15 hold - Clamshell  - 1 x daily - 7 x weekly - 2 sets - 10 reps - Seated Upper Trapezius Stretch  - 1 x daily - 7 x weekly - 2 reps - 30 hold  ASSESSMENT:  CLINICAL IMPRESSION: Patient is a 67 y.o. female who was seen today for physical therapy treatment for chronic knee pain. She reports the back is not as bad as the knee. She reports they have offered shots but she declined for now because she has gotten them in her other knee and shoulder before and it only lasted a few months. Was advised that it might be a good idea to get it for the R knee to provide some pain relief for at least a few months. Continued to work on strengthening today especially in lower extremities and for knees. Does well with all interventions  with no increase in pain.  OBJECTIVE IMPAIRMENTS: difficulty walking, decreased ROM, decreased strength, impaired flexibility, improper body mechanics, postural dysfunction, and pain.   ACTIVITY LIMITATIONS: standing, squatting, stairs, and locomotion level  REHAB POTENTIAL: Good  CLINICAL DECISION MAKING: Stable/uncomplicated  EVALUATION COMPLEXITY: Low   GOALS: Goals reviewed with patient? Yes  SHORT TERM GOALS: Target date: 09/06/23  Patient will be independent with initial HEP.  Goal status: MET   LONG TERM GOALS: Target date: 10/11/23  Patient will be independent with advanced/ongoing HEP to  improve outcomes and carryover.  Goal status: INITIAL  2.  Patient will report 75% improvement in low back pain to improve QOL.  Baseline: 9/10 Goal status: IN PROGRESS 4/10  3.  Patient will demonstrate full pain free lumbar ROM to perform ADLs.   Baseline: see chart Goal status: MET 09/27/23  4.  Patient will tolerate 30 min of (standing/sitting/walking) to perform household chores. Baseline: needs rest breaks while cooking and cleaning d/t pain Goal status: IN PROGRESS 09/27/23  5.  Patient will be able to go up and down stairs without pain Baseline: difficulty with stairs  Goal status: IN PROGRESS pain in R knee 09/27/23  PLAN:  PT FREQUENCY: 2x/week  PT DURATION: 10 weeks  PLANNED INTERVENTIONS: Therapeutic exercises, Therapeutic activity, Neuromuscular re-education, Balance training, Gait training, Patient/Family education, Self Care, Joint mobilization, Stair training, Dry Needling, Electrical stimulation, Spinal manipulation, Spinal mobilization, Cryotherapy, Moist heat, and Manual therapy.  PLAN FOR NEXT SESSION: resisted gait, more knee strengthening    Cassie Freer, PT 09/27/2023, 11:43 AM

## 2023-09-27 ENCOUNTER — Ambulatory Visit: Payer: Medicaid Other

## 2023-09-27 DIAGNOSIS — G8929 Other chronic pain: Secondary | ICD-10-CM

## 2023-09-27 DIAGNOSIS — M5459 Other low back pain: Secondary | ICD-10-CM

## 2023-09-27 DIAGNOSIS — M6281 Muscle weakness (generalized): Secondary | ICD-10-CM

## 2023-09-27 DIAGNOSIS — M25561 Pain in right knee: Secondary | ICD-10-CM | POA: Diagnosis not present

## 2023-09-27 DIAGNOSIS — R29898 Other symptoms and signs involving the musculoskeletal system: Secondary | ICD-10-CM

## 2023-09-28 NOTE — Therapy (Signed)
OUTPATIENT PHYSICAL THERAPY THORACOLUMBAR TREATMENT    Patient Name: Casey Powell MRN: [id number] DOB:10-03-1956, 67 y.o., female Today's Date: 09/29/2023  END OF SESSION:  PT End of Session - 09/29/23 1313     Visit Number 11    Date for PT Re-Evaluation 11/17/23    Authorization Type --    PT Start Time 1313    PT Stop Time 1400    PT Time Calculation (min) 47 min    Activity Tolerance Patient tolerated treatment well    Behavior During Therapy WFL for tasks assessed/performed                      Past Medical History:  Diagnosis Date   Asthma    GERD (gastroesophageal reflux disease)    Hyperlipidemia    Hypertension    Past Surgical History:  Procedure Laterality Date   ABDOMINAL HYSTERECTOMY     TONSILLECTOMY     Patient Active Problem List   Diagnosis Date Noted   DDD (degenerative disc disease), cervical 01/13/2022   DDD (degenerative disc disease), lumbar 01/13/2022   Primary osteoarthritis of both knees 11/30/2021   Chronic right shoulder pain 11/30/2021   DDD (degenerative disc disease), thoracic 11/30/2021    PCP: Gwinda Passe  REFERRING PROVIDER: Rodney Langton  REFERRING DIAG:  M51.36 (ICD-10-CM) - DDD (degenerative disc disease), lumbar    Rationale for Evaluation and Treatment: Rehabilitation  THERAPY DIAG:  Chronic pain of right knee  Muscle weakness (generalized)  Other symptoms and signs involving the musculoskeletal system  Other low back pain  Chronic pain of left knee  ONSET DATE: 07/04/23  SUBJECTIVE:                                                                                                                                                                                           SUBJECTIVE STATEMENT: The pain is better. The therapy has been helping beautifully, I do not feel as tight behind my legs. Would like to keep working on strength, stretching, and pain in the knees.   Daughter is  present at visit to translate  PERTINENT HISTORY:  Known lumbar DDD with a good-sized L3-L4 disc protrusion on MRI  PAIN:  Are you having pain? Yes: NPRS scale: 1/10 Pain location: back, knees Pain description: back feels like someone broke it, neck and down leg is just tight  Aggravating factors: standing, stress, walking far distances  Relieving factors: pain meds, shots in shoulder and knees   PRECAUTIONS: None  RED FLAGS: None   WEIGHT BEARING RESTRICTIONS: No  FALLS:  Has patient fallen in last 6 months?  No  LIVING ENVIRONMENT: Lives with: lives with their family Lives in: House/apartment Stairs: Yes: Internal: 15 steps; on right going up and External: 3 steps; none Has following equipment at home: None  OCCUPATION: Not working  PLOF: Independent  PATIENT GOALS: be able to climb stairs easier, lift up arms better, ease the back pain   NEXT MD VISIT: 08/16/23  OBJECTIVE:   DIAGNOSTIC FINDINGS:  Mild degenerative changes, without spinal canal stenosis or neural foraminal narrowing in the cervical, thoracic, or lumbar spine.  COGNITION: Overall cognitive status: Within functional limits for tasks assessed     SENSATION: WFL  MUSCLE LENGTH: Hamstrings: mild tightness bilaterally   POSTURE: rounded shoulders, forward head, and increased thoracic kyphosis  PALPATION: TTP L4-L5 and SIJ   LUMBAR ROM:   AROM eval 09/27/23  Flexion Able to touch toes   Extension 50%  WFL  Right lateral flexion Mid thigh with pain WFL  Left lateral flexion Mid thigh with pain WFL  Right rotation WFL   Left rotation WFL    (Blank rows = not tested)  LOWER EXTREMITY ROM:  WNL   LOWER EXTREMITY MMT:    MMT Right eval Left eval  Hip flexion 5 5  Hip extension    Hip abduction 4 4  Hip adduction 5 5  Hip internal rotation    Hip external rotation    Knee flexion 5 5  Knee extension 4 4  Ankle dorsiflexion    Ankle plantarflexion    Ankle inversion    Ankle  eversion     (Blank rows = not tested)  LUMBAR SPECIAL TESTS:  Straight leg raise test: Positive and FABER test: Positive  FUNCTIONAL TESTS:  5 times sit to stand: 12.58s Timed up and go (TUG): 12.35s   TODAY'S TREATMENT:                                                                                                                              DATE:  09/29/23 STS on airex 2x10 NuStep L5x4mins Step ups 6" Calf stretch 30s on slant  Walking on beam Leg ext 10# 2x12 HS curls 25# 2x12 Shoulder ext 5# 2x10  Cable rows 5# 2x10  09/27/23 Progress note 10th  NuStep L5x44min Lateral step ups 6" Calf stretch 15s x2  Calf raises 2x10 Leg press 20# 2x10 STS with chest press 2x10 HS stretch 30s x2   09/22/23 NuStep L5x38mins  Leg ext 10# 2x10 HS curls 20# 2x10 Calf raises 2x10 Seated row 20# 2x10 Seated hip abduction green 2x10 Ball squeeze with LAQ 2x10 2# ankle stepping over obstacles   09/20/23 Walking outdoors around [employer]  NuStep L5x57mins  Resisted side steps 20# x5 each side  STS with chest press yellow 2x10 2# hip abd/ext 2x10 2# marching 2x10 Leg press 20# 2x10  blackTB flexion 2x10   09/15/23 Step ups 6" Lateral step ups 6" Box taps from airex  Calf stretch 15s x2  on slant NuStep L5x87mins  Leg ext 10# 2x10 HS curls 20# 2x10 Walking on beam  STS on airex 2x10   09/08/23 NuStep L5 x35mins  Resisted gait 20# 4way x4 STS holding yellow ball 2x10 Calf raises 2x12 Leg press 20# 2x10 Passive stretching on LE and low back  Feet on pball rotations, knees to chest, small bridges    09/06/23 Step ups 4" Seated row 20# 2x10 Lat pull downs 20# 2x10  NuStep L5 x65mins  HS curls 20# 2x10  Leg ext 5# x10, 10# x10   STS with WaTe shoulder flexion 3# 2x10 Lateral band steps with green band  Seated HS stretch 15s each side   09/01/23 Calf stretch 15s x2 NuStep L5 x55mins  Shoulder ext 5# 2x10  Box taps 6"  Pec stretch in doorway 30s x2 blackTB  ext 2x10  Leg press 20# x10, 30# x10 Calf raises 2x10  STM to upper traps and low back, use of theragun Pball rotations and flexion x10   08/30/23 Bike L1 x2 mins stopped due to knee pain Red band rows and ext 2x10 STS with red ball OHP 2x10 3# LAQ 2x10 HS curls green 2x10 Fitter pushes 2 blue bands  Passive supine stretching HS, glutes, piriformis, trunk rotations, SK2C Feet on pball rotations and knees to chest STM with theragun to the upper traps, rhomboids and the lumbar area while she was sitting   08/25/23 Nustep level 4 x 5 minutes Red tband HS curls Red tband row Red tband extension Red tband clamshells Ball b/n knees squeeze Feet on ball K2C, trunk rotation, small bridge, iso abs 2.5# supine SAQ STM with theragun to the upper traps, rhomboids and the lumbar area while she was sitting  EVAL 08/02/23    PATIENT EDUCATION:  Education details: POC and HEP Person educated: Patient Education method: Explanation Education comprehension: verbalized understanding  HOME EXERCISE PROGRAM: Access Code: [email or web address] URL: [email or web address] Date: 08/02/2023 Prepared by: Cassie Freer  Exercises - Supine Lower Trunk Rotation  - 1 x daily - 7 x weekly - 2 sets - 10 reps - Supine Bridge  - 1 x daily - 7 x weekly - 2 sets - 10 reps - Supine Single Knee to Chest Stretch  - 1 x daily - 7 x weekly - 2 reps - 15 hold - Clamshell  - 1 x daily - 7 x weekly - 2 sets - 10 reps - Seated Upper Trapezius Stretch  - 1 x daily - 7 x weekly - 2 reps - 30 hold  ASSESSMENT:  CLINICAL IMPRESSION: Patient is a 67 y.o. female who was seen today for physical therapy treatment for chronic knee pain. She reports pain is better today. She reports PT has been really helpful and she would love if we could keep going for at least another month. Continued to work on strengthening today especially in lower extremities and for knees. Does well with all interventions with no increase in  pain. Will continue to benefit from ongoing strengthening and stretching to help with back and knee pain.  OBJECTIVE IMPAIRMENTS: difficulty walking, decreased ROM, decreased strength, impaired flexibility, improper body mechanics, postural dysfunction, and pain.   ACTIVITY LIMITATIONS: standing, squatting, stairs, and locomotion level  REHAB POTENTIAL: Good  CLINICAL DECISION MAKING: Stable/uncomplicated  EVALUATION COMPLEXITY: Low   GOALS: Goals reviewed with patient? Yes  SHORT TERM GOALS: Target date: 09/06/23  Patient will be independent with initial HEP.  Goal status: MET   LONG TERM GOALS: Target date:  10/11/23  Patient will be independent with advanced/ongoing HEP to improve outcomes and carryover.  Goal status: INITIAL  2.  Patient will report 75% improvement in low back pain to improve QOL.  Baseline: 9/10 Goal status: IN PROGRESS 4/10  3.  Patient will demonstrate full pain free lumbar ROM to perform ADLs.   Baseline: see chart Goal status: MET 09/27/23  4.  Patient will tolerate 30 min of (standing/sitting/walking) to perform household chores. Baseline: needs rest breaks while cooking and cleaning d/t pain Goal status: IN PROGRESS 09/27/23  5.  Patient will be able to go up and down stairs without pain Baseline: difficulty with stairs  Goal status: IN PROGRESS pain in R knee 09/27/23  PLAN:  PT FREQUENCY: 2x/week  PT DURATION: 10 weeks  PLANNED INTERVENTIONS: Therapeutic exercises, Therapeutic activity, Neuromuscular re-education, Balance training, Gait training, Patient/Family education, Self Care, Joint mobilization, Stair training, Dry Needling, Electrical stimulation, Spinal manipulation, Spinal mobilization, Cryotherapy, Moist heat, and Manual therapy.  PLAN FOR NEXT SESSION: resisted gait, knee strengthening, low back stretching  Cassie Freer, PT 09/29/2023, 1:54 PM

## 2023-09-29 ENCOUNTER — Ambulatory Visit: Payer: Medicaid Other

## 2023-09-29 DIAGNOSIS — G8929 Other chronic pain: Secondary | ICD-10-CM

## 2023-09-29 DIAGNOSIS — M6281 Muscle weakness (generalized): Secondary | ICD-10-CM

## 2023-09-29 DIAGNOSIS — M5459 Other low back pain: Secondary | ICD-10-CM

## 2023-09-29 DIAGNOSIS — R29898 Other symptoms and signs involving the musculoskeletal system: Secondary | ICD-10-CM

## 2023-09-29 DIAGNOSIS — M25561 Pain in right knee: Secondary | ICD-10-CM | POA: Diagnosis not present

## 2023-10-12 ENCOUNTER — Other Ambulatory Visit: Payer: Self-pay | Admitting: Physician Assistant

## 2023-10-12 DIAGNOSIS — Z13820 Encounter for screening for osteoporosis: Secondary | ICD-10-CM

## 2023-10-12 DIAGNOSIS — R5381 Other malaise: Secondary | ICD-10-CM

## 2023-10-20 ENCOUNTER — Ambulatory Visit: Payer: Medicaid Other | Attending: Primary Care | Admitting: Physical Therapy

## 2023-10-20 ENCOUNTER — Encounter: Payer: Self-pay | Admitting: Physical Therapy

## 2023-10-20 DIAGNOSIS — M25562 Pain in left knee: Secondary | ICD-10-CM | POA: Insufficient documentation

## 2023-10-20 DIAGNOSIS — R29898 Other symptoms and signs involving the musculoskeletal system: Secondary | ICD-10-CM | POA: Diagnosis present

## 2023-10-20 DIAGNOSIS — G8929 Other chronic pain: Secondary | ICD-10-CM | POA: Insufficient documentation

## 2023-10-20 DIAGNOSIS — M25561 Pain in right knee: Secondary | ICD-10-CM | POA: Insufficient documentation

## 2023-10-20 DIAGNOSIS — M549 Dorsalgia, unspecified: Secondary | ICD-10-CM | POA: Insufficient documentation

## 2023-10-20 DIAGNOSIS — M6281 Muscle weakness (generalized): Secondary | ICD-10-CM | POA: Insufficient documentation

## 2023-10-20 DIAGNOSIS — M5459 Other low back pain: Secondary | ICD-10-CM | POA: Diagnosis present

## 2023-10-20 NOTE — Therapy (Signed)
OUTPATIENT PHYSICAL THERAPY THORACOLUMBAR TREATMENT    Patient Name: Casey Powell MRN: [id number] DOB:02/02/1956, 66 y.o., female Today's Date: 10/20/2023  END OF SESSION:  PT End of Session - 10/20/23 1315     Visit Number 12    Date for PT Re-Evaluation 11/30/23    Authorization Type [place] [employer] 1/12    PT Start Time 1312    PT Stop Time 1400    PT Time Calculation (min) 48 min    Activity Tolerance Patient tolerated treatment well    Behavior During Therapy WFL for tasks assessed/performed                      Past Medical History:  Diagnosis Date   Asthma    GERD (gastroesophageal reflux disease)    Hyperlipidemia    Hypertension    Past Surgical History:  Procedure Laterality Date   ABDOMINAL HYSTERECTOMY     TONSILLECTOMY     Patient Active Problem List   Diagnosis Date Noted   DDD (degenerative disc disease), cervical 01/13/2022   DDD (degenerative disc disease), lumbar 01/13/2022   Primary osteoarthritis of both knees 11/30/2021   Chronic right shoulder pain 11/30/2021   DDD (degenerative disc disease), thoracic 11/30/2021    PCP: Gwinda Passe  REFERRING PROVIDER: Rodney Langton  REFERRING DIAG:  M51.36 (ICD-10-CM) - DDD (degenerative disc disease), lumbar    Rationale for Evaluation and Treatment: Rehabilitation  THERAPY DIAG:  Chronic pain of right knee  Muscle weakness (generalized)  Other symptoms and signs involving the musculoskeletal system  Other low back pain  Chronic pain of left knee  Mid back pain, chronic  ONSET DATE: 07/04/23  SUBJECTIVE:                                                                                                                                                                                           SUBJECTIVE STATEMENT: Patient has not been in to see [place] in about 3 weeks, she reports that she knows that when she comes to PT she really feels better and moves better, feels like  standing makes worse, the stretching helps the pain  Daughter is present at visit to translate  PERTINENT HISTORY:  Known lumbar DDD with a good-sized L3-L4 disc protrusion on MRI  PAIN:  Are you having pain? Yes: NPRS scale: 5/10 Pain location: back, knees Pain description: back feels like someone broke it, neck and down leg is just tight  Aggravating factors: standing, stress, walking far distances  Relieving factors: pain meds, shots in shoulder and knees   PRECAUTIONS: None  RED FLAGS: None  WEIGHT BEARING RESTRICTIONS: No  FALLS:  Has patient fallen in last 6 months? No  LIVING ENVIRONMENT: Lives with: lives with their family Lives in: House/apartment Stairs: Yes: Internal: 15 steps; on right going up and External: 3 steps; none Has following equipment at home: None  OCCUPATION: Not working  PLOF: Independent  PATIENT GOALS: be able to climb stairs easier, lift up arms better, ease the back pain   NEXT MD VISIT: 08/16/23  OBJECTIVE:   DIAGNOSTIC FINDINGS:  Mild degenerative changes, without spinal canal stenosis or neural foraminal narrowing in the cervical, thoracic, or lumbar spine.  COGNITION: Overall cognitive status: Within functional limits for tasks assessed     SENSATION: WFL  MUSCLE LENGTH: Hamstrings: mild tightness bilaterally   POSTURE: rounded shoulders, forward head, and increased thoracic kyphosis  PALPATION: TTP L4-L5 and SIJ   LUMBAR ROM:   AROM eval 09/27/23  Flexion Able to touch toes   Extension 50%  WFL  Right lateral flexion Mid thigh with pain WFL  Left lateral flexion Mid thigh with pain WFL  Right rotation WFL   Left rotation WFL    (Blank rows = not tested)  LOWER EXTREMITY ROM:  WNL   LOWER EXTREMITY MMT:    MMT Right eval Left eval  Hip flexion 5 5  Hip extension    Hip abduction 4 4  Hip adduction 5 5  Hip internal rotation    Hip external rotation    Knee flexion 5 5  Knee extension 4 4  Ankle  dorsiflexion    Ankle plantarflexion    Ankle inversion    Ankle eversion     (Blank rows = not tested)  LUMBAR SPECIAL TESTS:  Straight leg raise test: Positive and FABER test: Positive  FUNCTIONAL TESTS:  5 times sit to stand: 12.58s Timed up and go (TUG): 12.35s   TODAY'S TREATMENT:                                                                                                                              DATE:  10/20/23 Nustep level 5 x 6 mintues 15# rows 2x10 15# lats 2x10 5# AR press 2x10 Leg curls 20# 2x10 On airex balance activities ball toss, head turns, eyes closed Feet on ball K2C, rotation, bridge and isometric abs  09/29/23 STS on airex 2x10 NuStep L5x53mins Step ups 6" Calf stretch 30s on slant  Walking on beam Leg ext 10# 2x12 HS curls 25# 2x12 Shoulder ext 5# 2x10  Cable rows 5# 2x10  09/27/23 Progress note 10th  NuStep L5x54min Lateral step ups 6" Calf stretch 15s x2  Calf raises 2x10 Leg press 20# 2x10 STS with chest press 2x10 HS stretch 30s x2   09/22/23 NuStep L5x101mins  Leg ext 10# 2x10 HS curls 20# 2x10 Calf raises 2x10 Seated row 20# 2x10 Seated hip abduction green 2x10 Ball squeeze with LAQ 2x10 2# ankle stepping over obstacles   09/20/23 Walking outdoors  around [address]  NuStep L5x79mins  Resisted side steps 20# x5 each side  STS with chest press yellow 2x10 2# hip abd/ext 2x10 2# marching 2x10 Leg press 20# 2x10  blackTB flexion 2x10   09/15/23 Step ups 6" Lateral step ups 6" Box taps from airex  Calf stretch 15s x2 on slant NuStep L5x37mins  Leg ext 10# 2x10 HS curls 20# 2x10 Walking on beam  STS on airex 2x10   09/08/23 NuStep L5 x41mins  Resisted gait 20# 4way x4 STS holding yellow ball 2x10 Calf raises 2x12 Leg press 20# 2x10 Passive stretching on LE and low back  Feet on pball rotations, knees to chest, small bridges    09/06/23 Step ups 4" Seated row 20# 2x10 Lat pull downs 20# 2x10  NuStep L5  x52mins  HS curls 20# 2x10  Leg ext 5# x10, 10# x10   STS with WaTe shoulder flexion 3# 2x10 Lateral band steps with green band  Seated HS stretch 15s each side   09/01/23 Calf stretch 15s x2 NuStep L5 x83mins  Shoulder ext 5# 2x10  Box taps 6"  Pec stretch in doorway 30s x2 blackTB ext 2x10  Leg press 20# x10, 30# x10 Calf raises 2x10  STM to upper traps and low back, use of theragun Pball rotations and flexion x10   PATIENT EDUCATION:  Education details: POC and HEP Person educated: Patient Education method: Explanation Education comprehension: verbalized understanding  HOME EXERCISE PROGRAM: Access Code: [email or web address] URL: [email or web address] Date: 08/02/2023 Prepared by: Cassie Freer  Exercises - Supine Lower Trunk Rotation  - 1 x daily - 7 x weekly - 2 sets - 10 reps - Supine Bridge  - 1 x daily - 7 x weekly - 2 sets - 10 reps - Supine Single Knee to Chest Stretch  - 1 x daily - 7 x weekly - 2 reps - 15 hold - Clamshell  - 1 x daily - 7 x weekly - 2 sets - 10 reps - Seated Upper Trapezius Stretch  - 1 x daily - 7 x weekly - 2 reps - 30 hold  ASSESSMENT:  CLINICAL IMPRESSION: Patient is a 67 y.o. female who was seen today for physical therapy treatment for chronic knee pain. 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 We discussed this today Will continue to benefit from ongoing strengthening and stretching to help with back and knee pain.  OBJECTIVE IMPAIRMENTS: difficulty walking, decreased ROM, decreased strength, impaired flexibility, improper body mechanics, postural dysfunction, and pain.   ACTIVITY LIMITATIONS: standing, squatting, stairs, and locomotion level  REHAB POTENTIAL: Good  CLINICAL DECISION MAKING: Stable/uncomplicated  EVALUATION COMPLEXITY:  Low   GOALS: Goals reviewed with patient? Yes  SHORT TERM GOALS: Target date: 09/06/23  Patient will be independent with initial HEP.  Goal status: MET   LONG TERM GOALS: Target date: 10/11/23  Patient will be independent with advanced/ongoing HEP to improve outcomes and carryover.  Goal status: progressing 10/19/23  2.  Patient will report 75% improvement in low back pain to improve QOL.  Baseline: 9/10 Goal status: IN PROGRESS 10/20/23  3.  Patient will demonstrate full pain free lumbar ROM to perform ADLs.   Baseline: see chart Goal status: MET 09/27/23  4.  Patient will tolerate 30 min of (standing/sitting/walking) to perform household chores.  Baseline: needs rest breaks while cooking and cleaning d/t pain Goal status: IN PROGRESS 10/20/23  5.  Patient will be able to go up and down stairs without pain Baseline: difficulty with stairs  Goal status: IN PROGRESS 10/20/23  PLAN:  PT FREQUENCY: 2x/week  PT DURATION: 10 weeks  PLANNED INTERVENTIONS: Therapeutic exercises, Therapeutic activity, Neuromuscular re-education, Balance training, Gait training, Patient/Family education, Self Care, Joint mobilization, Stair training, Dry Needling, Electrical stimulation, Spinal manipulation, Spinal mobilization, Cryotherapy, Moist heat, and Manual therapy.  PLAN FOR NEXT SESSION: resisted gait, knee strengthening, low back stretching  Aby Gessel W, PT 10/20/2023, 1:17 PM

## 2023-10-25 ENCOUNTER — Ambulatory Visit: Payer: Medicaid Other | Admitting: Physical Therapy

## 2023-10-25 ENCOUNTER — Encounter: Payer: Self-pay | Admitting: Physical Therapy

## 2023-10-25 DIAGNOSIS — R29898 Other symptoms and signs involving the musculoskeletal system: Secondary | ICD-10-CM

## 2023-10-25 DIAGNOSIS — M25561 Pain in right knee: Secondary | ICD-10-CM | POA: Diagnosis not present

## 2023-10-25 DIAGNOSIS — M549 Dorsalgia, unspecified: Secondary | ICD-10-CM

## 2023-10-25 DIAGNOSIS — M5459 Other low back pain: Secondary | ICD-10-CM

## 2023-10-25 DIAGNOSIS — G8929 Other chronic pain: Secondary | ICD-10-CM

## 2023-10-25 DIAGNOSIS — M6281 Muscle weakness (generalized): Secondary | ICD-10-CM

## 2023-10-25 NOTE — Therapy (Signed)
OUTPATIENT PHYSICAL THERAPY THORACOLUMBAR TREATMENT    Patient Name: Casey Powell MRN: [id number] DOB:19-Sep-1956, 67 y.o., female Today's Date: 10/25/2023  END OF SESSION:  PT End of Session - 10/25/23 1311     Visit Number 13    Date for PT Re-Evaluation 11/30/23    Authorization Type [place] Medicaid 2/12    PT Start Time 1310    PT Stop Time 1400    PT Time Calculation (min) 50 min    Activity Tolerance Patient tolerated treatment well    Behavior During Therapy WFL for tasks assessed/performed                      Past Medical History:  Diagnosis Date   Asthma    GERD (gastroesophageal reflux disease)    Hyperlipidemia    Hypertension    Past Surgical History:  Procedure Laterality Date   ABDOMINAL HYSTERECTOMY     TONSILLECTOMY     Patient Active Problem List   Diagnosis Date Noted   DDD (degenerative disc disease), cervical 01/13/2022   DDD (degenerative disc disease), lumbar 01/13/2022   Primary osteoarthritis of both knees 11/30/2021   Chronic right shoulder pain 11/30/2021   DDD (degenerative disc disease), thoracic 11/30/2021    PCP: Gwinda Passe  REFERRING PROVIDER: Rodney Langton  REFERRING DIAG:  M51.36 (ICD-10-CM) - DDD (degenerative disc disease), lumbar    Rationale for Evaluation and Treatment: Rehabilitation  THERAPY DIAG:  Chronic pain of right knee  Muscle weakness (generalized)  Other symptoms and signs involving the musculoskeletal system  Other low back pain  Chronic pain of left knee  Mid back pain, chronic  ONSET DATE: 07/04/23  SUBJECTIVE:                                                                                                                                                                                           SUBJECTIVE STATEMENT: Patient reports feeling tight and sore int eh neck and back  Daughter is present at visit to translate  PERTINENT HISTORY:  Known lumbar DDD with a  good-sized L3-L4 disc protrusion on MRI  PAIN:  Are you having pain? Yes: NPRS scale: 5/10 Pain location: back, knees Pain description: back feels like someone broke it, neck and down leg is just tight  Aggravating factors: standing, stress, walking far distances  Relieving factors: pain meds, shots in shoulder and knees   PRECAUTIONS: None  RED FLAGS: None   WEIGHT BEARING RESTRICTIONS: No  FALLS:  Has patient fallen in last 6 months? No  LIVING ENVIRONMENT: Lives with: lives with their family Lives in: House/apartment Stairs: Yes:  Internal: 15 steps; on right going up and External: 3 steps; none Has following equipment at home: None  OCCUPATION: Not working  PLOF: Independent  PATIENT GOALS: be able to climb stairs easier, lift up arms better, ease the back pain   NEXT MD VISIT: 08/16/23  OBJECTIVE:   DIAGNOSTIC FINDINGS:  Mild degenerative changes, without spinal canal stenosis or neural foraminal narrowing in the cervical, thoracic, or lumbar spine.  COGNITION: Overall cognitive status: Within functional limits for tasks assessed     SENSATION: WFL  MUSCLE LENGTH: Hamstrings: mild tightness bilaterally   POSTURE: rounded shoulders, forward head, and increased thoracic kyphosis  PALPATION: TTP L4-L5 and SIJ   LUMBAR ROM:   AROM eval 09/27/23  Flexion Able to touch toes   Extension 50%  WFL  Right lateral flexion Mid thigh with pain WFL  Left lateral flexion Mid thigh with pain WFL  Right rotation WFL   Left rotation WFL    (Blank rows = not tested)  LOWER EXTREMITY ROM:  WNL   LOWER EXTREMITY MMT:    MMT Right eval Left eval  Hip flexion 5 5  Hip extension    Hip abduction 4 4  Hip adduction 5 5  Hip internal rotation    Hip external rotation    Knee flexion 5 5  Knee extension 4 4  Ankle dorsiflexion    Ankle plantarflexion    Ankle inversion    Ankle eversion     (Blank rows = not tested)  LUMBAR SPECIAL TESTS:  Straight leg  raise test: Positive and FABER test: Positive  FUNCTIONAL TESTS:  5 times sit to stand: 12.58s Timed up and go (TUG): 12.35s   TODAY'S TREATMENT:                                                                                                                              DATE:  10/25/23 Nustep level 5 x 6 minutes 5# hip extension 5# hip abuction 5# straight arm pulls on airex On airex volley ball Leg press 20# 2x10 Calf stretches Lats 15# Feet on ball K2C, rotation, bridge and isometric abs LE strethces  10/20/23 Nustep level 5 x 6 mintues 15# rows 2x10 15# lats 2x10 5# AR press 2x10 Leg curls 20# 2x10 On airex balance activities ball toss, head turns, eyes closed Feet on ball K2C, rotation, bridge and isometric abs  09/29/23 STS on airex 2x10 NuStep L5x35mins Step ups 6" Calf stretch 30s on slant  Walking on beam Leg ext 10# 2x12 HS curls 25# 2x12 Shoulder ext 5# 2x10  Cable rows 5# 2x10  09/27/23 Progress note 10th  NuStep L5x110min Lateral step ups 6" Calf stretch 15s x2  Calf raises 2x10 Leg press 20# 2x10 STS with chest press 2x10 HS stretch 30s x2   09/22/23 NuStep L5x8mins  Leg ext 10# 2x10 HS curls 20# 2x10 Calf raises 2x10 Seated row 20# 2x10 Seated hip abduction green 2x10 [employer]  squeeze with LAQ 2x10 2# ankle stepping over obstacles   09/20/23 Walking outdoors around [employer] L5x49mins  Resisted side steps 20# x5 each side  STS with chest press yellow 2x10 2# hip abd/ext 2x10 2# marching 2x10 Leg press 20# 2x10  blackTB flexion 2x10    PATIENT EDUCATION:  Education details: POC and HEP Person educated: Patient Education method: Explanation Education comprehension: verbalized understanding  HOME EXERCISE PROGRAM: Access Code: [email or web address] URL: [email or web address] Date: 08/02/2023 Prepared by: Cassie Freer  Exercises - Supine Lower Trunk Rotation  - 1 x daily - 7 x weekly - 2 sets - 10 reps - Supine Bridge   - 1 x daily - 7 x weekly - 2 sets - 10 reps - Supine Single Knee to Chest Stretch  - 1 x daily - 7 x weekly - 2 reps - 15 hold - Clamshell  - 1 x daily - 7 x weekly - 2 sets - 10 reps - Seated Upper Trapezius Stretch  - 1 x daily - 7 x weekly - 2 reps - 30 hold  ASSESSMENT:  CLINICAL IMPRESSION: Patient is a 67 y.o. female who was seen today for physical therapy treatment for chronic knee pain. Continued to advance exercises added Hip strength and again did some core work.  She does feel that the stretches really help her Will continue to benefit from ongoing strengthening and stretching to help with back and knee pain.  OBJECTIVE IMPAIRMENTS: difficulty walking, decreased ROM, decreased strength, impaired flexibility, improper body mechanics, postural dysfunction, and pain.   ACTIVITY LIMITATIONS: standing, squatting, stairs, and locomotion level  REHAB POTENTIAL: Good  CLINICAL DECISION MAKING: Stable/uncomplicated  EVALUATION COMPLEXITY: Low   GOALS: Goals reviewed with patient? Yes  SHORT TERM GOALS: Target date: 09/06/23  Patient will be independent with initial HEP.  Goal status: MET   LONG TERM GOALS: Target date: 10/11/23  Patient will be independent with advanced/ongoing HEP to improve outcomes and carryover.  Goal status: progressing 10/19/23  2.  Patient will report 75% improvement in low back pain to improve QOL.  Baseline: 9/10 Goal status: IN PROGRESS 10/20/23  3.  Patient will demonstrate full pain free lumbar ROM to perform ADLs.   Baseline: see chart Goal status: MET 09/27/23  4.  Patient will tolerate 30 min of (standing/sitting/walking) to perform household chores. Baseline: needs rest breaks while cooking and cleaning d/t pain Goal status: IN PROGRESS 10/20/23  5.  Patient will be able to go up and down stairs without pain Baseline: difficulty with stairs  Goal status: IN PROGRESS 10/20/23  PLAN:  PT FREQUENCY: 2x/week  PT DURATION: 10  weeks  PLANNED INTERVENTIONS: Therapeutic exercises, Therapeutic activity, Neuromuscular re-education, Balance training, Gait training, Patient/Family education, Self Care, Joint mobilization, Stair training, Dry Needling, Electrical stimulation, Spinal manipulation, Spinal mobilization, Cryotherapy, Moist heat, and Manual therapy.  PLAN FOR NEXT SESSION: resisted gait, knee strengthening, low back stretching  Fadel Clason W, PT 10/25/2023, 1:12 PM

## 2023-10-27 ENCOUNTER — Ambulatory Visit: Payer: Medicaid Other

## 2023-10-27 DIAGNOSIS — M6281 Muscle weakness (generalized): Secondary | ICD-10-CM

## 2023-10-27 DIAGNOSIS — R29898 Other symptoms and signs involving the musculoskeletal system: Secondary | ICD-10-CM

## 2023-10-27 DIAGNOSIS — M5459 Other low back pain: Secondary | ICD-10-CM

## 2023-10-27 DIAGNOSIS — G8929 Other chronic pain: Secondary | ICD-10-CM

## 2023-10-27 DIAGNOSIS — M25561 Pain in right knee: Secondary | ICD-10-CM | POA: Diagnosis not present

## 2023-10-27 NOTE — Therapy (Signed)
OUTPATIENT PHYSICAL THERAPY THORACOLUMBAR TREATMENT    Patient Name: Casey Powell MRN: [id number] DOB:Feb 14, 1956, 67 y.o., female Today's Date: 10/27/2023  END OF SESSION:  PT End of Session - 10/27/23 1144     Visit Number 14    Date for PT Re-Evaluation 11/30/23    Authorization Type [place] Medicaid 2/12    PT Start Time 1145    PT Stop Time 1230    PT Time Calculation (min) 45 min    Activity Tolerance Patient tolerated treatment well    Behavior During Therapy WFL for tasks assessed/performed                       Past Medical History:  Diagnosis Date   Asthma    GERD (gastroesophageal reflux disease)    Hyperlipidemia    Hypertension    Past Surgical History:  Procedure Laterality Date   ABDOMINAL HYSTERECTOMY     TONSILLECTOMY     Patient Active Problem List   Diagnosis Date Noted   DDD (degenerative disc disease), cervical 01/13/2022   DDD (degenerative disc disease), lumbar 01/13/2022   Primary osteoarthritis of both knees 11/30/2021   Chronic right shoulder pain 11/30/2021   DDD (degenerative disc disease), thoracic 11/30/2021    PCP: Gwinda Passe  REFERRING PROVIDER: Rodney Langton  REFERRING DIAG:  M51.36 (ICD-10-CM) - DDD (degenerative disc disease), lumbar    Rationale for Evaluation and Treatment: Rehabilitation  THERAPY DIAG:  Chronic pain of right knee  Muscle weakness (generalized)  Other symptoms and signs involving the musculoskeletal system  Other low back pain  Chronic pain of left knee  ONSET DATE: 07/04/23  SUBJECTIVE:                                                                                                                                                                                           SUBJECTIVE STATEMENT: My knees hurt, the right is worse. Back is okay.   Daughter is present at visit to translate  PERTINENT HISTORY:  Known lumbar DDD with a good-sized L3-L4 disc protrusion on  MRI  PAIN:  Are you having pain? Yes: NPRS scale: 5/10 Pain location: back, knees Pain description: back feels like someone broke it, neck and down leg is just tight  Aggravating factors: standing, stress, walking far distances  Relieving factors: pain meds, shots in shoulder and knees   PRECAUTIONS: None  RED FLAGS: None   WEIGHT BEARING RESTRICTIONS: No  FALLS:  Has patient fallen in last 6 months? No  LIVING ENVIRONMENT: Lives with: lives with their family Lives in: House/apartment Stairs: Yes: Internal: 15 steps; on  right going up and External: 3 steps; none Has following equipment at home: None  OCCUPATION: Not working  PLOF: Independent  PATIENT GOALS: be able to climb stairs easier, lift up arms better, ease the back pain   NEXT MD VISIT: 08/16/23  OBJECTIVE:   DIAGNOSTIC FINDINGS:  Mild degenerative changes, without spinal canal stenosis or neural foraminal narrowing in the cervical, thoracic, or lumbar spine.  COGNITION: Overall cognitive status: Within functional limits for tasks assessed     SENSATION: WFL  MUSCLE LENGTH: Hamstrings: mild tightness bilaterally   POSTURE: rounded shoulders, forward head, and increased thoracic kyphosis  PALPATION: TTP L4-L5 and SIJ   LUMBAR ROM:   AROM eval 09/27/23  Flexion Able to touch toes   Extension 50%  WFL  Right lateral flexion Mid thigh with pain WFL  Left lateral flexion Mid thigh with pain WFL  Right rotation WFL   Left rotation WFL    (Blank rows = not tested)  LOWER EXTREMITY ROM:  WNL   LOWER EXTREMITY MMT:    MMT Right eval Left eval  Hip flexion 5 5  Hip extension    Hip abduction 4 4  Hip adduction 5 5  Hip internal rotation    Hip external rotation    Knee flexion 5 5  Knee extension 4 4  Ankle dorsiflexion    Ankle plantarflexion    Ankle inversion    Ankle eversion     (Blank rows = not tested)  LUMBAR SPECIAL TESTS:  Straight leg raise test: Positive and FABER test:  Positive  FUNCTIONAL TESTS:  5 times sit to stand: 12.58s Timed up and go (TUG): 12.35s   TODAY'S TREATMENT:                                                                                                                              DATE:  10/27/23 Calf raises 2x12  Resisted gait 20# 4 way x4 NuStep L5 x58mins  Leg press 20# 2x10 Seated rows and lat pulls 20# 2x10  LE stretching  Walking on beam  Tandem hold on beam   10/25/23 Nustep level 5 x 6 minutes 5# hip extension 5# hip abuction 5# straight arm pulls on airex On airex volley ball Leg press 20# 2x10 Calf stretches Lats 15# Feet on ball K2C, rotation, bridge and isometric abs LE strethces  10/20/23 Nustep level 5 x 6 mintues 15# rows 2x10 15# lats 2x10 5# AR press 2x10 Leg curls 20# 2x10 On airex balance activities ball toss, head turns, eyes closed Feet on ball K2C, rotation, bridge and isometric abs  09/29/23 STS on airex 2x10 NuStep L5x51mins Step ups 6" Calf stretch 30s on slant  Walking on beam Leg ext 10# 2x12 HS curls 25# 2x12 Shoulder ext 5# 2x10  Cable rows 5# 2x10  09/27/23 Progress note 10th  NuStep L5x54min Lateral step ups 6" Calf stretch 15s x2  Calf raises 2x10 Leg press 20# 2x10  STS with chest press 2x10 HS stretch 30s x2   09/22/23 NuStep L5x85mins  Leg ext 10# 2x10 HS curls 20# 2x10 Calf raises 2x10 Seated row 20# 2x10 Seated hip abduction green 2x10 Ball squeeze with LAQ 2x10 2# ankle stepping over obstacles   09/20/23 Walking outdoors around [employer] L5x78mins  Resisted side steps 20# x5 each side  STS with chest press yellow 2x10 2# hip abd/ext 2x10 2# marching 2x10 Leg press 20# 2x10  blackTB flexion 2x10    PATIENT EDUCATION:  Education details: POC and HEP Person educated: Patient Education method: Explanation Education comprehension: verbalized understanding  HOME EXERCISE PROGRAM: Access Code: [email or web address] URL:  [email or web address] Date: 08/02/2023 Prepared by: Cassie Freer  Exercises - Supine Lower Trunk Rotation  - 1 x daily - 7 x weekly - 2 sets - 10 reps - Supine Bridge  - 1 x daily - 7 x weekly - 2 sets - 10 reps - Supine Single Knee to Chest Stretch  - 1 x daily - 7 x weekly - 2 reps - 15 hold - Clamshell  - 1 x daily - 7 x weekly - 2 sets - 10 reps - Seated Upper Trapezius Stretch  - 1 x daily - 7 x weekly - 2 reps - 30 hold  ASSESSMENT:  CLINICAL IMPRESSION: Patient is a 67 y.o. female who was seen today for physical therapy treatment for chronic knee pain. Continued to advance exercises for hip and knee strength. Reports muscle fatigue and soreness in the hips after resisted gait. States she will probably be sore but she likes the feeling.  Will continue to benefit from ongoing strengthening and stretching to help with back and knee pain.  OBJECTIVE IMPAIRMENTS: difficulty walking, decreased ROM, decreased strength, impaired flexibility, improper body mechanics, postural dysfunction, and pain.   ACTIVITY LIMITATIONS: standing, squatting, stairs, and locomotion level  REHAB POTENTIAL: Good  CLINICAL DECISION MAKING: Stable/uncomplicated  EVALUATION COMPLEXITY: Low   GOALS: Goals reviewed with patient? Yes  SHORT TERM GOALS: Target date: 09/06/23  Patient will be independent with initial HEP.  Goal status: MET   LONG TERM GOALS: Target date: 10/11/23  Patient will be independent with advanced/ongoing HEP to improve outcomes and carryover.  Goal status: progressing 10/19/23  2.  Patient will report 75% improvement in low back pain to improve QOL.  Baseline: 9/10 Goal status: IN PROGRESS 10/20/23  3.  Patient will demonstrate full pain free lumbar ROM to perform ADLs.   Baseline: see chart Goal status: MET 09/27/23  4.  Patient will tolerate 30 min of (standing/sitting/walking) to perform household chores. Baseline: needs rest breaks while cooking and  cleaning d/t pain Goal status: IN PROGRESS 10/20/23  5.  Patient will be able to go up and down stairs without pain Baseline: difficulty with stairs  Goal status: IN PROGRESS 10/20/23  PLAN:  PT FREQUENCY: 2x/week  PT DURATION: 10 weeks  PLANNED INTERVENTIONS: Therapeutic exercises, Therapeutic activity, Neuromuscular re-education, Balance training, Gait training, Patient/Family education, Self Care, Joint mobilization, Stair training, Dry Needling, Electrical stimulation, Spinal manipulation, Spinal mobilization, Cryotherapy, Moist heat, and Manual therapy.  PLAN FOR NEXT SESSION: resisted gait, knee strengthening, low back stretching  Cassie Freer, PT 10/27/2023, 12:26 PM

## 2023-11-01 ENCOUNTER — Ambulatory Visit: Payer: Medicaid Other | Admitting: Physical Therapy

## 2023-11-01 ENCOUNTER — Encounter: Payer: Self-pay | Admitting: Physical Therapy

## 2023-11-01 DIAGNOSIS — M6281 Muscle weakness (generalized): Secondary | ICD-10-CM

## 2023-11-01 DIAGNOSIS — M5459 Other low back pain: Secondary | ICD-10-CM

## 2023-11-01 DIAGNOSIS — R29898 Other symptoms and signs involving the musculoskeletal system: Secondary | ICD-10-CM

## 2023-11-01 DIAGNOSIS — M25561 Pain in right knee: Secondary | ICD-10-CM | POA: Diagnosis not present

## 2023-11-01 DIAGNOSIS — G8929 Other chronic pain: Secondary | ICD-10-CM

## 2023-11-01 NOTE — Therapy (Signed)
OUTPATIENT PHYSICAL THERAPY THORACOLUMBAR TREATMENT    Patient Name: Casey Powell MRN: [id number] DOB:March 05, 1956, 67 y.o., female Today's Date: 11/01/2023  END OF SESSION:  PT End of Session - 11/01/23 1311     Visit Number 15    Date for PT Re-Evaluation 11/30/23    Authorization Type [place] Medicaid 3/12    PT Start Time 1309    PT Stop Time 1400    PT Time Calculation (min) 51 min    Activity Tolerance Patient tolerated treatment well    Behavior During Therapy WFL for tasks assessed/performed                       Past Medical History:  Diagnosis Date   Asthma    GERD (gastroesophageal reflux disease)    Hyperlipidemia    Hypertension    Past Surgical History:  Procedure Laterality Date   ABDOMINAL HYSTERECTOMY     TONSILLECTOMY     Patient Active Problem List   Diagnosis Date Noted   DDD (degenerative disc disease), cervical 01/13/2022   DDD (degenerative disc disease), lumbar 01/13/2022   Primary osteoarthritis of both knees 11/30/2021   Chronic right shoulder pain 11/30/2021   DDD (degenerative disc disease), thoracic 11/30/2021    PCP: Gwinda Passe  REFERRING PROVIDER: Rodney Langton  REFERRING DIAG:  M51.36 (ICD-10-CM) - DDD (degenerative disc disease), lumbar    Rationale for Evaluation and Treatment: Rehabilitation  THERAPY DIAG:  Chronic pain of right knee  Muscle weakness (generalized)  Other symptoms and signs involving the musculoskeletal system  Other low back pain  Chronic pain of left knee  ONSET DATE: 07/04/23  SUBJECTIVE:                                                                                                                                                                                           SUBJECTIVE STATEMENT: Reports that she was very sore with last treatment and stayed sore for about 4 days , has new abdominal brace  Daughter is present at visit to translate  PERTINENT HISTORY:   Known lumbar DDD with a good-sized L3-L4 disc protrusion on MRI  PAIN:  Are you having pain? Yes: NPRS scale: 8/10 Pain location: back, knees Pain description: back feels like someone broke it, neck and down leg is just tight  Aggravating factors: standing, stress, walking far distances  Relieving factors: pain meds, shots in shoulder and knees   PRECAUTIONS: None  RED FLAGS: None   WEIGHT BEARING RESTRICTIONS: No  FALLS:  Has patient fallen in last 6 months? No  LIVING ENVIRONMENT: Lives with: lives with their  family Lives in: House/apartment Stairs: Yes: Internal: 15 steps; on right going up and External: 3 steps; none Has following equipment at home: None  OCCUPATION: Not working  PLOF: Independent  PATIENT GOALS: be able to climb stairs easier, lift up arms better, ease the back pain   NEXT MD VISIT: 08/16/23  OBJECTIVE:   DIAGNOSTIC FINDINGS:  Mild degenerative changes, without spinal canal stenosis or neural foraminal narrowing in the cervical, thoracic, or lumbar spine.  COGNITION: Overall cognitive status: Within functional limits for tasks assessed     SENSATION: WFL  MUSCLE LENGTH: Hamstrings: mild tightness bilaterally   POSTURE: rounded shoulders, forward head, and increased thoracic kyphosis  PALPATION: TTP L4-L5 and SIJ   LUMBAR ROM:   AROM eval 09/27/23  Flexion Able to touch toes   Extension 50%  WFL  Right lateral flexion Mid thigh with pain WFL  Left lateral flexion Mid thigh with pain WFL  Right rotation WFL   Left rotation WFL    (Blank rows = not tested)  LOWER EXTREMITY ROM:  WNL   LOWER EXTREMITY MMT:    MMT Right eval Left eval  Hip flexion 5 5  Hip extension    Hip abduction 4 4  Hip adduction 5 5  Hip internal rotation    Hip external rotation    Knee flexion 5 5  Knee extension 4 4  Ankle dorsiflexion    Ankle plantarflexion    Ankle inversion    Ankle eversion     (Blank rows = not tested)  LUMBAR  SPECIAL TESTS:  Straight leg raise test: Positive and FABER test: Positive  FUNCTIONAL TESTS:  5 times sit to stand: 12.58s Timed up and go (TUG): 12.35s   TODAY'S TREATMENT:                                                                                                                              DATE:  11/01/23 Leg curls 20# 2x10 Standing 5# straight arm pulls Nustep level 4 x 6 minutes 5# hip extension, abduction and SLR On airex ball toss, head turns On airex cone toe touch Feet on ball K2C, rotation, small bridge Passive LE stretches STM with tht eT-gun to the hips and low back due to the increased pain  10/27/23 Calf raises 2x12  Resisted gait 20# 4 way x4 NuStep L5 x57mins  Leg press 20# 2x10 Seated rows and lat pulls 20# 2x10  LE stretching  Walking on beam  Tandem hold on beam   10/25/23 Nustep level 5 x 6 minutes 5# hip extension 5# hip abuction 5# straight arm pulls on airex On airex volley ball Leg press 20# 2x10 Calf stretches Lats 15# Feet on ball K2C, rotation, bridge and isometric abs LE strethces  10/20/23 Nustep level 5 x 6 mintues 15# rows 2x10 15# lats 2x10 5# AR press 2x10 Leg curls 20# 2x10 On airex balance activities ball toss, head turns, eyes closed Feet  on ball K2C, rotation, bridge and isometric abs  09/29/23 STS on airex 2x10 NuStep L5x10mins Step ups 6" Calf stretch 30s on slant  Walking on beam Leg ext 10# 2x12 HS curls 25# 2x12 Shoulder ext 5# 2x10  Cable rows 5# 2x10  PATIENT EDUCATION:  Education details: POC and HEP Person educated: Patient Education method: Explanation Education comprehension: verbalized understanding  HOME EXERCISE PROGRAM: Access Code: [email or web address] URL: [email or web address] Date: 08/02/2023 Prepared by: Cassie Freer  Exercises - Supine Lower Trunk Rotation  - 1 x daily - 7 x weekly - 2 sets - 10 reps - Supine Bridge  - 1 x daily - 7 x weekly - 2 sets - 10 reps - Supine Single  Knee to Chest Stretch  - 1 x daily - 7 x weekly - 2 reps - 15 hold - Clamshell  - 1 x daily - 7 x weekly - 2 sets - 10 reps - Seated Upper Trapezius Stretch  - 1 x daily - 7 x weekly - 2 reps - 30 hold  ASSESSMENT:  CLINICAL IMPRESSION: Patient is a 67 y.o. female who was seen today for physical therapy treatment for chronic knee pain. Patient reports that she was and still is very sore after the last treatment, she has a new back brace that she is wearing for the diastisis recti, I think it will help.  Will continue to benefit from ongoing strengthening and stretching to help with back and knee pain.  OBJECTIVE IMPAIRMENTS: difficulty walking, decreased ROM, decreased strength, impaired flexibility, improper body mechanics, postural dysfunction, and pain.   ACTIVITY LIMITATIONS: standing, squatting, stairs, and locomotion level  REHAB POTENTIAL: Good  CLINICAL DECISION MAKING: Stable/uncomplicated  EVALUATION COMPLEXITY: Low   GOALS: Goals reviewed with patient? Yes  SHORT TERM GOALS: Target date: 09/06/23  Patient will be independent with initial HEP.  Goal status: MET   LONG TERM GOALS: Target date: 10/11/23  Patient will be independent with advanced/ongoing HEP to improve outcomes and carryover.  Goal status: progressing 10/19/23  2.  Patient will report 75% improvement in low back pain to improve QOL.  Baseline: 9/10 Goal status: IN PROGRESS 10/20/23  3.  Patient will demonstrate full pain free lumbar ROM to perform ADLs.   Baseline: see chart Goal status: MET 09/27/23  4.  Patient will tolerate 30 min of (standing/sitting/walking) to perform household chores. Baseline: needs rest breaks while cooking and cleaning d/t pain Goal status: IN PROGRESS 10/20/23  5.  Patient will be able to go up and down stairs without pain Baseline: difficulty with stairs  Goal status: IN PROGRESS 10/20/23  PLAN:  PT FREQUENCY: 2x/week  PT DURATION: 10 weeks  PLANNED  INTERVENTIONS: Therapeutic exercises, Therapeutic activity, Neuromuscular re-education, Balance training, Gait training, Patient/Family education, Self Care, Joint mobilization, Stair training, Dry Needling, Electrical stimulation, Spinal manipulation, Spinal mobilization, Cryotherapy, Moist heat, and Manual therapy.  PLAN FOR NEXT SESSION: assess goals  Jearld Lesch, PT 11/01/2023, 1:11 PM

## 2023-11-03 ENCOUNTER — Ambulatory Visit: Payer: Medicaid Other | Admitting: Physical Therapy

## 2023-11-03 ENCOUNTER — Encounter: Payer: Self-pay | Admitting: Physical Therapy

## 2023-11-03 DIAGNOSIS — M5459 Other low back pain: Secondary | ICD-10-CM

## 2023-11-03 DIAGNOSIS — M6281 Muscle weakness (generalized): Secondary | ICD-10-CM

## 2023-11-03 DIAGNOSIS — M25561 Pain in right knee: Secondary | ICD-10-CM | POA: Diagnosis not present

## 2023-11-03 DIAGNOSIS — R29898 Other symptoms and signs involving the musculoskeletal system: Secondary | ICD-10-CM

## 2023-11-03 DIAGNOSIS — G8929 Other chronic pain: Secondary | ICD-10-CM

## 2023-11-03 NOTE — Therapy (Signed)
OUTPATIENT PHYSICAL THERAPY THORACOLUMBAR TREATMENT    Patient Name: Casey Powell MRN: [id number] DOB:08-20-1956, 67 y.o., female Today's Date: 11/03/2023  END OF SESSION:  PT End of Session - 11/03/23 1318     Visit Number 16    Date for PT Re-Evaluation 11/30/23    Authorization Type [place] Medicaid 4/12    PT Start Time 1313    PT Stop Time 1358    PT Time Calculation (min) 45 min    Activity Tolerance Patient tolerated treatment well    Behavior During Therapy WFL for tasks assessed/performed                       Past Medical History:  Diagnosis Date   Asthma    GERD (gastroesophageal reflux disease)    Hyperlipidemia    Hypertension    Past Surgical History:  Procedure Laterality Date   ABDOMINAL HYSTERECTOMY     TONSILLECTOMY     Patient Active Problem List   Diagnosis Date Noted   DDD (degenerative disc disease), cervical 01/13/2022   DDD (degenerative disc disease), lumbar 01/13/2022   Primary osteoarthritis of both knees 11/30/2021   Chronic right shoulder pain 11/30/2021   DDD (degenerative disc disease), thoracic 11/30/2021    PCP: Gwinda Passe  REFERRING PROVIDER: Rodney Langton  REFERRING DIAG:  M51.36 (ICD-10-CM) - DDD (degenerative disc disease), lumbar    Rationale for Evaluation and Treatment: Rehabilitation  THERAPY DIAG:  Chronic pain of right knee  Muscle weakness (generalized)  Other symptoms and signs involving the musculoskeletal system  Other low back pain  Chronic pain of left knee  ONSET DATE: 07/04/23  SUBJECTIVE:                                                                                                                                                                                           SUBJECTIVE STATEMENT: A little soreness in the right knee and in both hips  Daughter is present at visit to translate  PERTINENT HISTORY:  Known lumbar DDD with a good-sized L3-L4 disc protrusion on  MRI  PAIN:  Are you having pain? Yes: NPRS scale: 6/10 Pain location: back, knees Pain description: back feels like someone broke it, neck and down leg is just tight  Aggravating factors: standing, stress, walking far distances  Relieving factors: pain meds, shots in shoulder and knees   PRECAUTIONS: None  RED FLAGS: None   WEIGHT BEARING RESTRICTIONS: No  FALLS:  Has patient fallen in last 6 months? No  LIVING ENVIRONMENT: Lives with: lives with their family Lives in: House/apartment Stairs: Yes: Internal: 15 steps; on  right going up and External: 3 steps; none Has following equipment at home: None  OCCUPATION: Not working  PLOF: Independent  PATIENT GOALS: be able to climb stairs easier, lift up arms better, ease the back pain   NEXT MD VISIT: 08/16/23  OBJECTIVE:   DIAGNOSTIC FINDINGS:  Mild degenerative changes, without spinal canal stenosis or neural foraminal narrowing in the cervical, thoracic, or lumbar spine.  COGNITION: Overall cognitive status: Within functional limits for tasks assessed     SENSATION: WFL  MUSCLE LENGTH: Hamstrings: mild tightness bilaterally   POSTURE: rounded shoulders, forward head, and increased thoracic kyphosis  PALPATION: TTP L4-L5 and SIJ   LUMBAR ROM:   AROM eval 09/27/23  Flexion Able to touch toes   Extension 50%  WFL  Right lateral flexion Mid thigh with pain WFL  Left lateral flexion Mid thigh with pain WFL  Right rotation WFL   Left rotation WFL    (Blank rows = not tested)  LOWER EXTREMITY ROM:  WNL   LOWER EXTREMITY MMT:    MMT Right eval Left eval  Hip flexion 5 5  Hip extension    Hip abduction 4 4  Hip adduction 5 5  Hip internal rotation    Hip external rotation    Knee flexion 5 5  Knee extension 4 4  Ankle dorsiflexion    Ankle plantarflexion    Ankle inversion    Ankle eversion     (Blank rows = not tested)  LUMBAR SPECIAL TESTS:  Straight leg raise test: Positive and FABER test:  Positive  FUNCTIONAL TESTS:  5 times sit to stand: 12.58s Timed up and go (TUG): 12.35s   TODAY'S TREATMENT:                                                                                                                              DATE:  11/03/23 Nustep level 5 x 7 minutes 20# leg curls 2x10 5# hip extension and abduction 5# straight arm pulls 10# AR press Feet on ball K2C, rotation, bridge, isometric abs Passive LE stretches STM with the T-gun to the hips posterior and laterally  11/01/23 Leg curls 20# 2x10 Standing 5# straight arm pulls Nustep level 4 x 6 minutes 5# hip extension, abduction and SLR On airex ball toss, head turns On airex cone toe touch Feet on ball K2C, rotation, small bridge Passive LE stretches STM with tht eT-gun to the hips and low back due to the increased pain  10/27/23 Calf raises 2x12  Resisted gait 20# 4 way x4 NuStep L5 x55mins  Leg press 20# 2x10 Seated rows and lat pulls 20# 2x10  LE stretching  Walking on beam  Tandem hold on beam   10/25/23 Nustep level 5 x 6 minutes 5# hip extension 5# hip abuction 5# straight arm pulls on airex On airex volley ball Leg press 20# 2x10 Calf stretches Lats 15# Feet on ball K2C, rotation, bridge and isometric abs  LE strethces  10/20/23 Nustep level 5 x 6 mintues 15# rows 2x10 15# lats 2x10 5# AR press 2x10 Leg curls 20# 2x10 On airex balance activities ball toss, head turns, eyes closed Feet on ball K2C, rotation, bridge and isometric abs  09/29/23 STS on airex 2x10 NuStep L5x40mins Step ups 6" Calf stretch 30s on slant  Walking on beam Leg ext 10# 2x12 HS curls 25# 2x12 Shoulder ext 5# 2x10  Cable rows 5# 2x10  PATIENT EDUCATION:  Education details: POC and HEP Person educated: Patient Education method: Explanation Education comprehension: verbalized understanding  HOME EXERCISE PROGRAM: Access Code: [email or web address] URL: [email or web address] Date:  08/02/2023 Prepared by: Cassie Freer  Exercises - Supine Lower Trunk Rotation  - 1 x daily - 7 x weekly - 2 sets - 10 reps - Supine Bridge  - 1 x daily - 7 x weekly - 2 sets - 10 reps - Supine Single Knee to Chest Stretch  - 1 x daily - 7 x weekly - 2 reps - 15 hold - Clamshell  - 1 x daily - 7 x weekly - 2 sets - 10 reps - Seated Upper Trapezius Stretch  - 1 x daily - 7 x weekly - 2 reps - 30 hold  ASSESSMENT:  CLINICAL IMPRESSION: Patient is a 67 y.o. female who was seen today for physical therapy treatment for chronic knee pain.  Has right knee pain and bilateral hip pain, she does report that she is tolerating the shopping better now than she was about 3-4 months ago, less pain overall and reports that she feels stronger and able to do more now.  Will continue to benefit from ongoing strengthening and stretching to help with back and knee pain.  OBJECTIVE IMPAIRMENTS: difficulty walking, decreased ROM, decreased strength, impaired flexibility, improper body mechanics, postural dysfunction, and pain.   ACTIVITY LIMITATIONS: standing, squatting, stairs, and locomotion level  REHAB POTENTIAL: Good  CLINICAL DECISION MAKING: Stable/uncomplicated  EVALUATION COMPLEXITY: Low   GOALS: Goals reviewed with patient? Yes  SHORT TERM GOALS: Target date: 09/06/23  Patient will be independent with initial HEP.  Goal status: MET   LONG TERM GOALS: Target date: 10/11/23  Patient will be independent with advanced/ongoing HEP to improve outcomes and carryover.  Goal status: progressing 10/19/23  2.  Patient will report 75% improvement in low back pain to improve QOL.  Baseline: 9/10 Goal status: progressing 10/24/23  3.  Patient will demonstrate full pain free lumbar ROM to perform ADLs.   Baseline: see chart Goal status: MET 09/27/23  4.  Patient will tolerate 30 min of (standing/sitting/walking) to perform household chores. Baseline: needs rest breaks while cooking and cleaning d/t  pain Goal status: progressing 11/03/23  5.  Patient will be able to go up and down stairs without pain Baseline: difficulty with stairs  Goal status: progressing 11/03/23  PLAN:  PT FREQUENCY: 2x/week  PT DURATION: 10 weeks  PLANNED INTERVENTIONS: Therapeutic exercises, Therapeutic activity, Neuromuscular re-education, Balance training, Gait training, Patient/Family education, Self Care, Joint mobilization, Stair training, Dry Needling, Electrical stimulation, Spinal manipulation, Spinal mobilization, Cryotherapy, Moist heat, and Manual therapy.  PLAN FOR NEXT SESSION: continue to push the functional abilities  Jearld Lesch, PT 11/03/2023, 1:19 PM

## 2023-11-10 ENCOUNTER — Ambulatory Visit (INDEPENDENT_AMBULATORY_CARE_PROVIDER_SITE_OTHER): Payer: Medicaid Other

## 2023-11-10 ENCOUNTER — Other Ambulatory Visit: Payer: Self-pay

## 2023-11-10 ENCOUNTER — Encounter (HOSPITAL_COMMUNITY): Payer: Self-pay | Admitting: *Deleted

## 2023-11-10 ENCOUNTER — Ambulatory Visit (HOSPITAL_COMMUNITY)
Admission: EM | Admit: 2023-11-10 | Discharge: 2023-11-10 | Disposition: A | Discharge status: Home / Self Care | Payer: Medicaid Other | Attending: Internal Medicine | Admitting: Internal Medicine

## 2023-11-10 DIAGNOSIS — J441 Chronic obstructive pulmonary disease with (acute) exacerbation: Secondary | ICD-10-CM

## 2023-11-10 LAB — POC COVID19/FLU A&B COMBO
Covid Antigen, POC: NEGATIVE
Influenza A Antigen, POC: NEGATIVE
Influenza B Antigen, POC: NEGATIVE

## 2023-11-10 LAB — CBC WITH DIFFERENTIAL/PLATELET
Abs Immature Granulocytes: 0.09 10*3/uL — ABNORMAL HIGH (ref 0.00–0.07)
Basophils Absolute: 0.1 10*3/uL (ref 0.0–0.1)
Basophils Relative: 0 %
Eosinophils Absolute: 0 10*3/uL (ref 0.0–0.5)
Eosinophils Relative: 0 %
HCT: 46 % (ref 36.0–46.0)
Hemoglobin: 15.3 g/dL — ABNORMAL HIGH (ref 12.0–15.0)
Immature Granulocytes: 1 %
Lymphocytes Relative: 12 %
Lymphs Abs: 2.1 10*3/uL (ref 0.7–4.0)
MCH: 29.9 pg (ref 26.0–34.0)
MCHC: 33.3 g/dL (ref 30.0–36.0)
MCV: 90 fL (ref 80.0–100.0)
Monocytes Absolute: 1 10*3/uL (ref 0.1–1.0)
Monocytes Relative: 6 %
Neutro Abs: 14.2 10*3/uL — ABNORMAL HIGH (ref 1.7–7.7)
Neutrophils Relative %: 81 %
Platelets: 249 10*3/uL (ref 150–400)
RBC: 5.11 MIL/uL (ref 3.87–5.11)
RDW: 11.9 % (ref 11.5–15.5)
WBC: 17.5 10*3/uL — ABNORMAL HIGH (ref 4.0–10.5)
nRBC: 0 % (ref 0.0–0.2)

## 2023-11-10 LAB — BASIC METABOLIC PANEL
Anion gap: 11 (ref 5–15)
BUN: 10 mg/dL (ref 8–23)
CO2: 26 mmol/L (ref 22–32)
Calcium: 9.5 mg/dL (ref 8.9–10.3)
Chloride: 100 mmol/L (ref 98–111)
Creatinine, Ser: 0.77 mg/dL (ref 0.44–1.00)
GFR, Estimated: >60 mL/min (ref >60)
Glucose, Bld: 100 mg/dL — ABNORMAL HIGH (ref 70–99)
Potassium: 3.9 mmol/L (ref 3.5–5.1)
Sodium: 137 mmol/L (ref 135–145)

## 2023-11-10 MED ORDER — DOXYCYCLINE HYCLATE 100 MG PO CAPS: 100.0000 mg | ORAL_CAPSULE | Freq: Two times a day (BID) | ORAL | 0 refills | Status: AC

## 2023-11-10 MED ORDER — PREDNISONE 20 MG PO TABS: 40.0000 mg | ORAL_TABLET | Freq: Every day | ORAL | 0 refills | Status: AC

## 2023-11-10 MED ORDER — GUAIFENESIN ER 600 MG PO TB12
600.0000 mg | ORAL_TABLET | Freq: Two times a day (BID) | ORAL | 0 refills | Status: AC
Start: 2023-11-10 — End: ?

## 2023-11-10 MED ORDER — BENZONATATE 200 MG PO CAPS: 200.0000 mg | ORAL_CAPSULE | Freq: Three times a day (TID) | ORAL | 0 refills | Status: AC | PRN

## 2023-11-10 NOTE — ED Triage Notes (Signed)
Pt has been sick for 4 days with cough and fever. Pt had a fever of 100.3 . Pt unable to use asthma .

## 2023-11-10 NOTE — ED Notes (Signed)
Daughter in room will translate for PT.

## 2023-11-10 NOTE — ED Notes (Signed)
Grend son had PNA recently

## 2023-11-10 NOTE — ED Provider Notes (Signed)
[facility]    CSN: [id number] Arrival date & time: 11/10/23  1319      History   Chief Complaint Chief Complaint  Patient presents with   Cough   Fever    HPI Casey Powell is a 67 y.o. female with a history of chronic tobacco use comes to the urgent care with 4-day history of fever of 100.3 Fahrenheit, cough productive of yellowish sputum wheezing and shortness of breath.  Patient symptoms started 4 days ago and has been persistent.  Patient is unable to use her inhalers.  No chest pain or chest pressure.  Patient endorses chest tightness.  No nausea, vomiting or diarrhea.  Patient does not use home oxygen.  She endorses positive sick contact.  Patient complains of generalized weakness.  Her oral intake is decreased over the course of 4 days.  No dysuria urgency or frequency.   HPI  Past Medical History:  Diagnosis Date   Asthma    GERD (gastroesophageal reflux disease)    Hyperlipidemia    Hypertension     Patient Active Problem List   Diagnosis Date Noted   DDD (degenerative disc disease), cervical 01/13/2022   DDD (degenerative disc disease), lumbar 01/13/2022   Primary osteoarthritis of both knees 11/30/2021   Chronic right shoulder pain 11/30/2021   DDD (degenerative disc disease), thoracic 11/30/2021    Past Surgical History:  Procedure Laterality Date   ABDOMINAL HYSTERECTOMY     TONSILLECTOMY      OB History   No obstetric history on file.      Home Medications    Prior to Admission medications   Medication Sig Start Date End Date Taking? Authorizing Provider  albuterol (PROVENTIL HFA;VENTOLIN HFA) 108 (90 BASE) MCG/ACT inhaler Inhale 2 puffs into the lungs every 6 (six) hours as needed. For shortness of breath    Yes [provider]  benzonatate (TESSALON) 200 MG capsule Take 1 capsule (200 mg total) by mouth 3 (three) times daily as needed for cough. 11/10/23  Yes Gennifer Potenza, Britta Mccreedy, MD  doxycycline (VIBRAMYCIN) 100 MG  capsule Take 1 capsule (100 mg total) by mouth 2 (two) times daily for 7 days. 11/10/23 11/17/23 Yes Kelena Garrow, Britta Mccreedy, MD  escitalopram (LEXAPRO) 10 MG tablet Take 10 mg by mouth daily.     Yes [provider]  Fluticasone-Salmeterol (ADVAIR) 500-50 MCG/DOSE AEPB Inhale 1 puff into the lungs every 12 (twelve) hours.   Yes [provider]  guaiFENesin (MUCINEX) 600 MG 12 hr tablet Take 1 tablet (600 mg total) by mouth 2 (two) times daily. 11/10/23  Yes Pistol Kessenich, Britta Mccreedy, MD  HYDROcodone-acetaminophen (NORCO) 10-325 MG tablet Take 1 tablet by mouth every 8 (eight) hours as needed. 07/04/23  Yes Monica Becton, MD  levothyroxine (SYNTHROID, LEVOTHROID) 100 MCG tablet Take 100 mcg by mouth daily before breakfast.   Yes [provider]  loratadine (CLARITIN) 10 MG tablet Take 10 mg by mouth daily.     Yes [provider]  losartan (COZAAR) 25 MG tablet Take 25 mg by mouth daily.   Yes [provider]  mirtazapine (REMERON) 15 MG tablet Take 15 mg by mouth at bedtime.   Yes [provider]  montelukast (SINGULAIR) 10 MG tablet Take 10 mg by mouth at bedtime.   Yes [provider]  omeprazole (PRILOSEC) 20 MG capsule Take 20 mg by mouth daily.   Yes [provider]  pravastatin (PRAVACHOL) 40 MG tablet Take 40 mg by mouth  daily.   Yes [provider]  predniSONE (DELTASONE) 20 MG tablet Take 2 tablets (40 mg total) by mouth daily for 5 days. 11/10/23 11/15/23 Yes Devario Bucklew, Britta Mccreedy, MD  Travoprost, BAK Free, (TRAVATAN) 0.004 % SOLN ophthalmic solution Place 1 drop into both eyes at bedtime.   Yes [provider]  cloNIDine (CATAPRES) 0.1 MG tablet Take 0.1 mg by mouth 2 (two) times daily.    [provider]  EPINEPHrine (EPIPEN 2-PAK) 0.3 mg/0.3 mL IJ SOAJ injection Inject 0.3 mLs (0.3 mg total) into the muscle once. As directed for severe allergic reaction 01/15/15   Presson, Mathis Fare, PA    Family  History Family History  Problem Relation Age of Onset   Alzheimer's disease Mother    Cancer Mother    Stroke Father     Social History Social History   Tobacco Use   Smoking status: Every Day  Substance Use Topics   Alcohol use: No   Drug use: No     Allergies   Metronidazole   Review of Systems Review of Systems As per HPI  Physical Exam Triage Vital Signs ED Triage Vitals  Encounter Vitals Group     BP 11/10/23 1335 (!) 160/79     Systolic BP Percentile --      Diastolic BP Percentile --      Pulse Rate 11/10/23 1335 91     Resp 11/10/23 1335 20     Temp 11/10/23 1335 98.4 F (36.9 C)     Temp src --      SpO2 11/10/23 1335 91 %     Weight --      Height --      Head Circumference --      Peak Flow --      Pain Score 11/10/23 1331 9     Pain Loc --      Pain Education --      Exclude from Growth Chart --    No data found.  Updated Vital Signs BP (!) 160/79   Pulse 91   Temp 98.4 F (36.9 C)   Resp 20   SpO2 91%   Visual Acuity Right Eye Distance:   Left Eye Distance:   Bilateral Distance:    Right Eye Near:   Left Eye Near:    Bilateral Near:     Physical Exam Vitals and nursing note reviewed.  Constitutional:      General: She is in acute distress.     Appearance: She is ill-appearing.  HENT:     Right Ear: Tympanic membrane normal.     Left Ear: Tympanic membrane normal.     Mouth/Throat:     Mouth: Mucous membranes are moist.     Pharynx: No oropharyngeal exudate or posterior oropharyngeal erythema.  Cardiovascular:     Rate and Rhythm: Normal rate and regular rhythm.     Pulses: Normal pulses.     Heart sounds: Normal heart sounds.  Pulmonary:     Effort: Pulmonary effort is normal.     Breath sounds: Wheezing and rhonchi present.  Abdominal:     General: Bowel sounds are normal.     Palpations: Abdomen is soft.  Skin:    General: Skin is warm and dry.  Neurological:     Mental Status: She is alert.      UC  Treatments / Results  Labs (all labs ordered are listed, but only abnormal results are displayed) Labs Reviewed  CBC WITH DIFFERENTIAL/PLATELET  BASIC METABOLIC PANEL  POC COVID19/FLU A&B COMBO    EKG   Radiology No results found.  Procedures Procedures (including critical care time)  Medications Ordered in UC Medications - No data to display  Initial Impression / Assessment and Plan / UC Course  I have reviewed the triage vital signs and the nursing notes.  Pertinent labs & imaging results that were available during my care of the patient were reviewed by me and considered in my medical decision making (see chart for details).     1.  COPD with acute exacerbation: Chest x-ray is negative for acute lung infiltrates COVID/flu is negative Doxycycline 100 mg twice daily for 7 days Prednisone 40 mg orally daily for 5 days Patient is encouraged to use her inhalers Tessalon Perles as needed for cough Mucinex 600 mg twice daily Patient is advised to push fluids. Final Clinical Impressions(s) / UC Diagnoses   Final diagnoses:  COPD exacerbation (HCC)     Discharge Instructions      Please increase oral fluid intake Chest x-ray is negative for acute lung infiltrate COVID and flu test are negative Please take medications as prescribed Smoke cessation will help with the resolution of your symptoms If you have worsening symptoms please return to urgent care to be reevaluated.     ED Prescriptions     Medication Sig Dispense Auth. Provider   predniSONE (DELTASONE) 20 MG tablet Take 2 tablets (40 mg total) by mouth daily for 5 days. 10 tablet Rayana Geurin, Britta Mccreedy, MD   doxycycline (VIBRAMYCIN) 100 MG capsule Take 1 capsule (100 mg total) by mouth 2 (two) times daily for 7 days. 14 capsule Aldina Porta, Britta Mccreedy, MD   guaiFENesin (MUCINEX) 600 MG 12 hr tablet Take 1 tablet (600 mg total) by mouth 2 (two) times daily. 20 tablet Amman Bartel, Britta Mccreedy, MD   benzonatate (TESSALON) 200  MG capsule Take 1 capsule (200 mg total) by mouth 3 (three) times daily as needed for cough. 21 capsule Kathrina Crosley, Britta Mccreedy, MD      PDMP not reviewed this encounter.   Merrilee Jansky, MD 11/10/23 1450

## 2023-11-10 NOTE — Discharge Instructions (Signed)
Please increase oral fluid intake Chest x-ray is negative for acute lung infiltrate COVID and flu test are negative Please take medications as prescribed Smoke cessation will help with the resolution of your symptoms If you have worsening symptoms please return to urgent care to be reevaluated.

## 2023-11-11 ENCOUNTER — Telehealth: Payer: Self-pay | Admitting: Internal Medicine

## 2023-11-11 MED ORDER — AMOXICILLIN-POT CLAVULANATE 875-125 MG PO TABS: 1.0000 | ORAL_TABLET | Freq: Two times a day (BID) | ORAL | 0 refills | Status: AC

## 2023-11-11 NOTE — Telephone Encounter (Signed)
Reviewed labs and CXR form 11/10/2023. Leukocytosis with questionable infiltrate. Given history of COPD, will add Augementin 875mg  BID x 7 days to the Doxycycline regimen.

## 2023-11-22 ENCOUNTER — Ambulatory Visit: Payer: Medicaid Other | Attending: Primary Care | Admitting: Physical Therapy

## 2023-11-22 ENCOUNTER — Encounter: Payer: Self-pay | Admitting: Physical Therapy

## 2023-11-22 DIAGNOSIS — M6281 Muscle weakness (generalized): Secondary | ICD-10-CM | POA: Insufficient documentation

## 2023-11-22 DIAGNOSIS — G8929 Other chronic pain: Secondary | ICD-10-CM | POA: Insufficient documentation

## 2023-11-22 DIAGNOSIS — M549 Dorsalgia, unspecified: Secondary | ICD-10-CM | POA: Diagnosis present

## 2023-11-22 DIAGNOSIS — M5459 Other low back pain: Secondary | ICD-10-CM | POA: Diagnosis present

## 2023-11-22 DIAGNOSIS — R29898 Other symptoms and signs involving the musculoskeletal system: Secondary | ICD-10-CM | POA: Insufficient documentation

## 2023-11-22 DIAGNOSIS — M25561 Pain in right knee: Secondary | ICD-10-CM | POA: Insufficient documentation

## 2023-11-22 DIAGNOSIS — M25562 Pain in left knee: Secondary | ICD-10-CM | POA: Insufficient documentation

## 2023-11-22 NOTE — Therapy (Signed)
 OUTPATIENT PHYSICAL THERAPY THORACOLUMBAR TREATMENT    Patient Name: Di Jasmer MRN: [id number] DOB:February 12, 1956, 68 y.o., female Today's Date: 11/22/2023  END OF SESSION:  PT End of Session - 11/22/23 1703     Visit Number 17    Authorization Type [place] Medicaid 6/12    PT Start Time 1700    PT Stop Time 1747    PT Time Calculation (min) 47 min    Activity Tolerance Patient tolerated treatment well    Behavior During Therapy WFL for tasks assessed/performed                       Past Medical History:  Diagnosis Date   Asthma    GERD (gastroesophageal reflux disease)    Hyperlipidemia    Hypertension    Past Surgical History:  Procedure Laterality Date   ABDOMINAL HYSTERECTOMY     TONSILLECTOMY     Patient Active Problem List   Diagnosis Date Noted   DDD (degenerative disc disease), cervical 01/13/2022   DDD (degenerative disc disease), lumbar 01/13/2022   Primary osteoarthritis of both knees 11/30/2021   Chronic right shoulder pain 11/30/2021   DDD (degenerative disc disease), thoracic 11/30/2021    PCP: Rosaline Bohr  REFERRING PROVIDER: Debby Petties  REFERRING DIAG:  M51.36 (ICD-10-CM) - DDD (degenerative disc disease), lumbar    Rationale for Evaluation and Treatment: Rehabilitation  THERAPY DIAG:  Chronic pain of right knee  Muscle weakness (generalized)  Other symptoms and signs involving the musculoskeletal system  Other low back pain  Chronic pain of left knee  ONSET DATE: 07/04/23  SUBJECTIVE:                                                                                                                                                                                           SUBJECTIVE STATEMENT: Patient reports that she has not been feeling well, had pneumonia, reports that her back and knees are really hurting again.  Reports that she is getting over this but not moving very well Daughter is present at visit to  translate  PERTINENT HISTORY:  Known lumbar DDD with a good-sized L3-L4 disc protrusion on MRI  PAIN:  Are you having pain? Yes: NPRS scale: 6/10 Pain location: back, knees Pain description: back feels like someone broke it, neck and down leg is just tight  Aggravating factors: standing, stress, walking far distances  Relieving factors: pain meds, shots in shoulder and knees   PRECAUTIONS: None  RED FLAGS: None   WEIGHT BEARING RESTRICTIONS: No  FALLS:  Has patient fallen in last 6 months? No  LIVING ENVIRONMENT: Lives  with: lives with their family Lives in: House/apartment Stairs: Yes: Internal: 15 steps; on right going up and External: 3 steps; none Has following equipment at home: None  OCCUPATION: Not working  PLOF: Independent  PATIENT GOALS: be able to climb stairs easier, lift up arms better, ease the back pain   NEXT MD VISIT: 08/16/23  OBJECTIVE:   DIAGNOSTIC FINDINGS:  Mild degenerative changes, without spinal canal stenosis or neural foraminal narrowing in the cervical, thoracic, or lumbar spine.  COGNITION: Overall cognitive status: Within functional limits for tasks assessed     SENSATION: WFL  MUSCLE LENGTH: Hamstrings: mild tightness bilaterally   POSTURE: rounded shoulders, forward head, and increased thoracic kyphosis  PALPATION: TTP L4-L5 and SIJ   LUMBAR ROM:   AROM eval 09/27/23  Flexion Able to touch toes   Extension 50%  WFL  Right lateral flexion Mid thigh with pain WFL  Left lateral flexion Mid thigh with pain WFL  Right rotation WFL   Left rotation WFL    (Blank rows = not tested)  LOWER EXTREMITY ROM:  WNL   LOWER EXTREMITY MMT:    MMT Right eval Left eval  Hip flexion 5 5  Hip extension    Hip abduction 4 4  Hip adduction 5 5  Hip internal rotation    Hip external rotation    Knee flexion 5 5  Knee extension 4 4  Ankle dorsiflexion    Ankle plantarflexion    Ankle inversion    Ankle eversion     (Blank  rows = not tested)  LUMBAR SPECIAL TESTS:  Straight leg raise test: Positive and FABER test: Positive  FUNCTIONAL TESTS:  5 times sit to stand: 12.58s Timed up and go (TUG): 12.35s   TODAY'S TREATMENT:                                                                                                                              DATE:  11/22/23 Nustep level 5 x 7 minutes Tried the bike but his was too much pain with the bend on the right knee 15# Leg curls 2x12 10# rows 2x12 5# straight arm pulls cues to engage core 5# hip extension Feet on ball K2C, rotation, bridge and isometric abs Passive stretch LE's STM to the low back into the upper traps  11/03/23 Nustep level 5 x 7 minutes 20# leg curls 2x10 5# hip extension and abduction 5# straight arm pulls 10# AR press Feet on ball K2C, rotation, bridge, isometric abs Passive LE stretches STM with the T-gun to the hips posterior and laterally  11/01/23 Leg curls 20# 2x10 Standing 5# straight arm pulls Nustep level 4 x 6 minutes 5# hip extension, abduction and SLR On airex ball toss, head turns On airex cone toe touch Feet on ball K2C, rotation, small bridge Passive LE stretches STM with tht eT-gun to the hips and low back due to the increased pain  10/27/23 Calf raises  2x12  Resisted gait 20# 4 way x4 NuStep L5 x17mins  Leg press 20# 2x10 Seated rows and lat pulls 20# 2x10  LE stretching  Walking on beam  Tandem hold on beam   10/25/23 Nustep level 5 x 6 minutes 5# hip extension 5# hip abuction 5# straight arm pulls on airex On airex volley ball Leg press 20# 2x10 Calf stretches Lats 15# Feet on ball K2C, rotation, bridge and isometric abs LE strethces  10/20/23 Nustep level 5 x 6 mintues 15# rows 2x10 15# lats 2x10 5# AR press 2x10 Leg curls 20# 2x10 On airex balance activities ball toss, head turns, eyes closed Feet on ball K2C, rotation, bridge and isometric abs  09/29/23 STS on airex 2x10 NuStep  L5x81mins Step ups 6 Calf stretch 30s on slant  Walking on beam Leg ext 10# 2x12 HS curls 25# 2x12 Shoulder ext 5# 2x10  Cable rows 5# 2x10  PATIENT EDUCATION:  Education details: POC and HEP Person educated: Patient Education method: Explanation Education comprehension: verbalized understanding  HOME EXERCISE PROGRAM: Access Code: [email or web address] URL: [email or web address] Date: 08/02/2023 Prepared by: Almetta Fam  Exercises - Supine Lower Trunk Rotation  - 1 x daily - 7 x weekly - 2 sets - 10 reps - Supine Bridge  - 1 x daily - 7 x weekly - 2 sets - 10 reps - Supine Single Knee to Chest Stretch  - 1 x daily - 7 x weekly - 2 reps - 15 hold - Clamshell  - 1 x daily - 7 x weekly - 2 sets - 10 reps - Seated Upper Trapezius Stretch  - 1 x daily - 7 x weekly - 2 reps - 30 hold  ASSESSMENT:  CLINICAL IMPRESSION: Patient is a 68 y.o. female who was seen today for physical therapy treatment for chronic knee pain.  Patient was sick over the past week and really has not done much and is reporting increase pain in the knees and the back.  She was moving slower and had more difficulty getting up and down. Will continue to benefit from ongoing strengthening and stretching to help with back and knee pain.  OBJECTIVE IMPAIRMENTS: difficulty walking, decreased ROM, decreased strength, impaired flexibility, improper body mechanics, postural dysfunction, and pain.   ACTIVITY LIMITATIONS: standing, squatting, stairs, and locomotion level  REHAB POTENTIAL: Good  CLINICAL DECISION MAKING: Stable/uncomplicated  EVALUATION COMPLEXITY: Low   GOALS: Goals reviewed with patient? Yes  SHORT TERM GOALS: Target date: 09/06/23  Patient will be independent with initial HEP.  Goal status: MET   LONG TERM GOALS: Target date: 10/11/23  Patient will be independent with advanced/ongoing HEP to improve outcomes and carryover.  Goal status: progressing 10/19/23  2.  Patient will report 75%  improvement in low back pain to improve QOL.  Baseline: 9/10 Goal status: progressing 10/24/23  3.  Patient will demonstrate full pain free lumbar ROM to perform ADLs.   Baseline: see chart Goal status: MET 09/27/23  4.  Patient will tolerate 30 min of (standing/sitting/walking) to perform household chores. Baseline: needs rest breaks while cooking and cleaning d/t pain Goal status: progressing 11/03/23  5.  Patient will be able to go up and down stairs without pain Baseline: difficulty with stairs  Goal status: progressing 11/03/23  PLAN:  PT FREQUENCY: 2x/week  PT DURATION: 10 weeks  PLANNED INTERVENTIONS: Therapeutic exercises, Therapeutic activity, Neuromuscular re-education, Balance training, Gait training, Patient/Family education, Self Care, Joint mobilization, Stair training, Dry Needling, Electrical stimulation, Spinal manipulation, Spinal mobilization,  Cryotherapy, Moist heat, and Manual therapy.  PLAN FOR NEXT SESSION: in the next week will need to assess and ask for more visits if needed  OBADIAH OZELL ORN, PT 11/22/2023, 5:05 PM

## 2023-11-23 NOTE — Therapy (Signed)
 OUTPATIENT PHYSICAL THERAPY THORACOLUMBAR TREATMENT    Patient Name: Casey Powell MRN: [id number] DOB:22-Aug-1956, 68 y.o., female Today's Date: 11/24/2023  END OF SESSION:  PT End of Session - 11/24/23 1232     Visit Number 18    Authorization Type [place] [employer] 6/12    PT Start Time 1230    PT Stop Time 1315    PT Time Calculation (min) 45 min    Activity Tolerance Patient tolerated treatment well    Behavior During Therapy WFL for tasks assessed/performed                        Past Medical History:  Diagnosis Date   Asthma    GERD (gastroesophageal reflux disease)    Hyperlipidemia    Hypertension    Past Surgical History:  Procedure Laterality Date   ABDOMINAL HYSTERECTOMY     TONSILLECTOMY     Patient Active Problem List   Diagnosis Date Noted   DDD (degenerative disc disease), cervical 01/13/2022   DDD (degenerative disc disease), lumbar 01/13/2022   Primary osteoarthritis of both knees 11/30/2021   Chronic right shoulder pain 11/30/2021   DDD (degenerative disc disease), thoracic 11/30/2021    PCP: Rosaline Bohr  REFERRING PROVIDER: Debby Petties  REFERRING DIAG:  M51.36 (ICD-10-CM) - DDD (degenerative disc disease), lumbar    Rationale for Evaluation and Treatment: Rehabilitation  THERAPY DIAG:  Chronic pain of right knee  Muscle weakness (generalized)  Other symptoms and signs involving the musculoskeletal system  Other low back pain  ONSET DATE: 07/04/23  SUBJECTIVE:                                                                                                                                                                                           SUBJECTIVE STATEMENT: Have not slept all night because of throbbing pain in my knee. The back is okay.   Daughter is present at visit to translate  PERTINENT HISTORY:  Known lumbar DDD with a good-sized L3-L4 disc protrusion on MRI  PAIN:  Are you having pain?  Yes: NPRS scale: 6/10 Pain location: back, knees Pain description: back feels like someone broke it, neck and down leg is just tight  Aggravating factors: standing, stress, walking far distances  Relieving factors: pain meds, shots in shoulder and knees   PRECAUTIONS: None  RED FLAGS: None   WEIGHT BEARING RESTRICTIONS: No  FALLS:  Has patient fallen in last 6 months? No  LIVING ENVIRONMENT: Lives with: lives with their family Lives in: House/apartment Stairs: Yes: Internal: 15 steps; on right going up and External: 3 steps;  none Has following equipment at home: None  OCCUPATION: Not working  PLOF: Independent  PATIENT GOALS: be able to climb stairs easier, lift up arms better, ease the back pain   NEXT MD VISIT: 08/16/23  OBJECTIVE:   DIAGNOSTIC FINDINGS:  Mild degenerative changes, without spinal canal stenosis or neural foraminal narrowing in the cervical, thoracic, or lumbar spine.  COGNITION: Overall cognitive status: Within functional limits for tasks assessed     SENSATION: WFL  MUSCLE LENGTH: Hamstrings: mild tightness bilaterally   POSTURE: rounded shoulders, forward head, and increased thoracic kyphosis  PALPATION: TTP L4-L5 and SIJ   LUMBAR ROM:   AROM eval 09/27/23  Flexion Able to touch toes   Extension 50%  WFL  Right lateral flexion Mid thigh with pain WFL  Left lateral flexion Mid thigh with pain WFL  Right rotation WFL   Left rotation WFL    (Blank rows = not tested)  LOWER EXTREMITY ROM:  WNL   LOWER EXTREMITY MMT:    MMT Right eval Left eval  Hip flexion 5 5  Hip extension    Hip abduction 4 4  Hip adduction 5 5  Hip internal rotation    Hip external rotation    Knee flexion 5 5  Knee extension 4 4  Ankle dorsiflexion    Ankle plantarflexion    Ankle inversion    Ankle eversion     (Blank rows = not tested)  LUMBAR SPECIAL TESTS:  Straight leg raise test: Positive and FABER test: Positive  FUNCTIONAL TESTS:  5  times sit to stand: 12.58s Timed up and go (TUG): 12.35s   TODAY'S TREATMENT:                                                                                                                              DATE:  11/24/23 NuStep L5x44mins Step ups 4 Lateral step ups 4  Leg ext 10# 2x12 HS curls 20# 2x12  Feet on ball K2C, rotation, bridge and isometric abs Stretching for HS, knees to chest  11/22/23 Nustep level 5 x 7 minutes Tried the bike but his was too much pain with the bend on the right knee 15# Leg curls 2x12 10# rows 2x12 5# straight arm pulls cues to engage core 5# hip extension Feet on ball K2C, rotation, bridge and isometric abs Passive stretch LE's STM to the low back into the upper traps  11/03/23 Nustep level 5 x 7 minutes 20# leg curls 2x10 5# hip extension and abduction 5# straight arm pulls 10# AR press Feet on ball K2C, rotation, bridge, isometric abs Passive LE stretches STM with the T-gun to the hips posterior and laterally  11/01/23 Leg curls 20# 2x10 Standing 5# straight arm pulls Nustep level 4 x 6 minutes 5# hip extension, abduction and SLR On airex ball toss, head turns On airex cone toe touch Feet on ball K2C, rotation, small bridge Passive LE stretches STM with tht eT-gun  to the hips and low back due to the increased pain  10/27/23 Calf raises 2x12  Resisted gait 20# 4 way x4 NuStep L5 x49mins  Leg press 20# 2x10 Seated rows and lat pulls 20# 2x10  LE stretching  Walking on beam  Tandem hold on beam   10/25/23 Nustep level 5 x 6 minutes 5# hip extension 5# hip abuction 5# straight arm pulls on airex On airex volley ball Leg press 20# 2x10 Calf stretches Lats 15# Feet on ball K2C, rotation, bridge and isometric abs LE strethces  10/20/23 Nustep level 5 x 6 mintues 15# rows 2x10 15# lats 2x10 5# AR press 2x10 Leg curls 20# 2x10 On airex balance activities ball toss, head turns, eyes closed Feet on ball K2C, rotation, bridge  and isometric abs  09/29/23 STS on airex 2x10 NuStep L5x78mins Step ups 6 Calf stretch 30s on slant  Walking on beam Leg ext 10# 2x12 HS curls 25# 2x12 Shoulder ext 5# 2x10  Cable rows 5# 2x10  PATIENT EDUCATION:  Education details: POC and HEP Person educated: Patient Education method: Explanation Education comprehension: verbalized understanding  HOME EXERCISE PROGRAM: Access Code: [email or web address] URL: [email or web address] Date: 08/02/2023 Prepared by: Almetta Fam  Exercises - Supine Lower Trunk Rotation  - 1 x daily - 7 x weekly - 2 sets - 10 reps - Supine Bridge  - 1 x daily - 7 x weekly - 2 sets - 10 reps - Supine Single Knee to Chest Stretch  - 1 x daily - 7 x weekly - 2 reps - 15 hold - Clamshell  - 1 x daily - 7 x weekly - 2 sets - 10 reps - Seated Upper Trapezius Stretch  - 1 x daily - 7 x weekly - 2 reps - 30 hold  ASSESSMENT:  CLINICAL IMPRESSION: Patient is a 68 y.o. female who was seen today for physical therapy treatment for chronic knee pain.  Reports increase pain in the knees and the back. Thinks it might be because the weather. She states she is unable to bend her knee and has pain when she tries. Her daughter says she had an accident a few weeks ago trying to get out of the bathtub. Patient states she did not hurt herself and was able to get up on her own. We spoke about possibly getting gel shots to help with the pain. She was moving slower and had more difficulty getting up and down. Will continue to benefit from ongoing strengthening and stretching to help with back and knee pain.  OBJECTIVE IMPAIRMENTS: difficulty walking, decreased ROM, decreased strength, impaired flexibility, improper body mechanics, postural dysfunction, and pain.   ACTIVITY LIMITATIONS: standing, squatting, stairs, and locomotion level  REHAB POTENTIAL: Good  CLINICAL DECISION MAKING: Stable/uncomplicated  EVALUATION COMPLEXITY: Low   GOALS: Goals reviewed with  patient? Yes  SHORT TERM GOALS: Target date: 09/06/23  Patient will be independent with initial HEP.  Goal status: MET   LONG TERM GOALS: Target date: 10/11/23  Patient will be independent with advanced/ongoing HEP to improve outcomes and carryover.  Goal status: progressing 10/19/23  2.  Patient will report 75% improvement in low back pain to improve QOL.  Baseline: 9/10 Goal status: progressing 10/24/23  3.  Patient will demonstrate full pain free lumbar ROM to perform ADLs.   Baseline: see chart Goal status: MET 09/27/23  4.  Patient will tolerate 30 min of (standing/sitting/walking) to perform household chores. Baseline: needs rest breaks while cooking and cleaning d/t pain Goal  status: progressing 11/03/23  5.  Patient will be able to go up and down stairs without pain Baseline: difficulty with stairs  Goal status: progressing 11/03/23  PLAN:  PT FREQUENCY: 2x/week  PT DURATION: 10 weeks  PLANNED INTERVENTIONS: Therapeutic exercises, Therapeutic activity, Neuromuscular re-education, Balance training, Gait training, Patient/Family education, Self Care, Joint mobilization, Stair training, Dry Needling, Electrical stimulation, Spinal manipulation, Spinal mobilization, Cryotherapy, Moist heat, and Manual therapy.  PLAN FOR NEXT SESSION: in the next week will need to assess and ask for more visits if needed  [facility], PT 11/24/2023, 1:11 PM

## 2023-11-24 ENCOUNTER — Ambulatory Visit: Payer: Medicaid Other

## 2023-11-24 DIAGNOSIS — M6281 Muscle weakness (generalized): Secondary | ICD-10-CM

## 2023-11-24 DIAGNOSIS — M25561 Pain in right knee: Secondary | ICD-10-CM | POA: Diagnosis not present

## 2023-11-24 DIAGNOSIS — R29898 Other symptoms and signs involving the musculoskeletal system: Secondary | ICD-10-CM

## 2023-11-24 DIAGNOSIS — M5459 Other low back pain: Secondary | ICD-10-CM

## 2023-11-24 DIAGNOSIS — G8929 Other chronic pain: Secondary | ICD-10-CM

## 2023-11-29 ENCOUNTER — Ambulatory Visit: Payer: Medicaid Other

## 2023-11-29 DIAGNOSIS — R29898 Other symptoms and signs involving the musculoskeletal system: Secondary | ICD-10-CM

## 2023-11-29 DIAGNOSIS — M5459 Other low back pain: Secondary | ICD-10-CM

## 2023-11-29 DIAGNOSIS — M25561 Pain in right knee: Secondary | ICD-10-CM | POA: Diagnosis not present

## 2023-11-29 DIAGNOSIS — M6281 Muscle weakness (generalized): Secondary | ICD-10-CM

## 2023-11-29 DIAGNOSIS — G8929 Other chronic pain: Secondary | ICD-10-CM

## 2023-11-29 NOTE — Therapy (Signed)
 OUTPATIENT PHYSICAL THERAPY THORACOLUMBAR TREATMENT    Patient Name: Casey Powell MRN: [id number] DOB:02-09-1956, 68 y.o., female Today's Date: 11/29/2023  END OF SESSION:  PT End of Session - 11/29/23 1316     Visit Number 19    Date for PT Re-Evaluation 01/10/24    Authorization Type --    PT Start Time 1315    PT Stop Time 1400    PT Time Calculation (min) 45 min    Activity Tolerance Patient tolerated treatment well    Behavior During Therapy WFL for tasks assessed/performed                         Past Medical History:  Diagnosis Date   Asthma    GERD (gastroesophageal reflux disease)    Hyperlipidemia    Hypertension    Past Surgical History:  Procedure Laterality Date   ABDOMINAL HYSTERECTOMY     TONSILLECTOMY     Patient Active Problem List   Diagnosis Date Noted   DDD (degenerative disc disease), cervical 01/13/2022   DDD (degenerative disc disease), lumbar 01/13/2022   Primary osteoarthritis of both knees 11/30/2021   Chronic right shoulder pain 11/30/2021   DDD (degenerative disc disease), thoracic 11/30/2021    PCP: Rosaline Bohr  REFERRING PROVIDER: Debby Petties  REFERRING DIAG:  M51.36 (ICD-10-CM) - DDD (degenerative disc disease), lumbar    Rationale for Evaluation and Treatment: Rehabilitation  THERAPY DIAG:  Chronic pain of right knee  Muscle weakness (generalized)  Other symptoms and signs involving the musculoskeletal system  Chronic pain of left knee  Other low back pain  ONSET DATE: 07/04/23  SUBJECTIVE:                                                                                                                                                                                           SUBJECTIVE STATEMENT: Have not slept all night because of throbbing pain in my knee. The back is okay.   Daughter is present at visit to translate  PERTINENT HISTORY:  Known lumbar DDD with a good-sized L3-L4  disc protrusion on MRI  PAIN:  Are you having pain? Yes: NPRS scale: 6/10 Pain location: back, knees Pain description: back feels like someone broke it, neck and down leg is just tight  Aggravating factors: standing, stress, walking far distances  Relieving factors: pain meds, shots in shoulder and knees   PRECAUTIONS: None  RED FLAGS: None   WEIGHT BEARING RESTRICTIONS: No  FALLS:  Has patient fallen in last 6 months? No  LIVING ENVIRONMENT: Lives with: lives with their family Lives in: House/apartment  Stairs: Yes: Internal: 15 steps; on right going up and External: 3 steps; none Has following equipment at home: None  OCCUPATION: Not working  PLOF: Independent  PATIENT GOALS: be able to climb stairs easier, lift up arms better, ease the back pain   NEXT MD VISIT: 08/16/23  OBJECTIVE:   DIAGNOSTIC FINDINGS:  Mild degenerative changes, without spinal canal stenosis or neural foraminal narrowing in the cervical, thoracic, or lumbar spine.  COGNITION: Overall cognitive status: Within functional limits for tasks assessed     SENSATION: WFL  MUSCLE LENGTH: Hamstrings: mild tightness bilaterally   POSTURE: rounded shoulders, forward head, and increased thoracic kyphosis  PALPATION: TTP L4-L5 and SIJ   LUMBAR ROM:   AROM eval 09/27/23  Flexion Able to touch toes   Extension 50%  WFL  Right lateral flexion Mid thigh with pain WFL  Left lateral flexion Mid thigh with pain WFL  Right rotation WFL   Left rotation WFL    (Blank rows = not tested)  LOWER EXTREMITY ROM:  WNL   LOWER EXTREMITY MMT:    MMT Right eval Left eval  Hip flexion 5 5  Hip extension    Hip abduction 4 4  Hip adduction 5 5  Hip internal rotation    Hip external rotation    Knee flexion 5 5  Knee extension 4 4  Ankle dorsiflexion    Ankle plantarflexion    Ankle inversion    Ankle eversion     (Blank rows = not tested)  LUMBAR SPECIAL TESTS:  Straight leg raise test:  Positive and FABER test: Positive  FUNCTIONAL TESTS:  5 times sit to stand: 12.58s Timed up and go (TUG): 12.35s   TODAY'S TREATMENT:                                                                                                                              DATE:  11/28/13 Recert, recheck goals  Leg press 20# 2x10 Calf raises 2x12 NuStep L5x47mins  5xSTS 16.46s  Resisted gait 20# 4 way x4  Step ups 4   11/24/23 NuStep L5x55mins Step ups 4 Lateral step ups 4  Leg ext 10# 2x12 HS curls 20# 2x12  Feet on ball K2C, rotation, bridge and isometric abs Stretching for HS, knees to chest  11/22/23 Nustep level 5 x 7 minutes Tried the bike but his was too much pain with the bend on the right knee 15# Leg curls 2x12 10# rows 2x12 5# straight arm pulls cues to engage core 5# hip extension Feet on ball K2C, rotation, bridge and isometric abs Passive stretch LE's STM to the low back into the upper traps  11/03/23 Nustep level 5 x 7 minutes 20# leg curls 2x10 5# hip extension and abduction 5# straight arm pulls 10# AR press Feet on ball K2C, rotation, bridge, isometric abs Passive LE stretches STM with the T-gun to the hips posterior and laterally  11/01/23 Leg curls 20#  2x10 Standing 5# straight arm pulls Nustep level 4 x 6 minutes 5# hip extension, abduction and SLR On airex ball toss, head turns On airex cone toe touch Feet on ball K2C, rotation, small bridge Passive LE stretches STM with tht eT-gun to the hips and low back due to the increased pain  10/27/23 Calf raises 2x12  Resisted gait 20# 4 way x4 NuStep L5 x18mins  Leg press 20# 2x10 Seated rows and lat pulls 20# 2x10  LE stretching  Walking on beam  Tandem hold on beam   10/25/23 Nustep level 5 x 6 minutes 5# hip extension 5# hip abuction 5# straight arm pulls on airex On airex volley ball Leg press 20# 2x10 Calf stretches Lats 15# Feet on ball K2C, rotation, bridge and isometric abs LE  strethces  10/20/23 Nustep level 5 x 6 mintues 15# rows 2x10 15# lats 2x10 5# AR press 2x10 Leg curls 20# 2x10 On airex balance activities ball toss, head turns, eyes closed Feet on ball K2C, rotation, bridge and isometric abs  09/29/23 STS on airex 2x10 NuStep L5x71mins Step ups 6 Calf stretch 30s on slant  Walking on beam Leg ext 10# 2x12 HS curls 25# 2x12 Shoulder ext 5# 2x10  Cable rows 5# 2x10  PATIENT EDUCATION:  Education details: POC and HEP Person educated: Patient Education method: Explanation Education comprehension: verbalized understanding  HOME EXERCISE PROGRAM: Access Code: [email or web address] URL: [email or web address] Date: 08/02/2023 Prepared by: Almetta Fam  Exercises - Supine Lower Trunk Rotation  - 1 x daily - 7 x weekly - 2 sets - 10 reps - Supine Bridge  - 1 x daily - 7 x weekly - 2 sets - 10 reps - Supine Single Knee to Chest Stretch  - 1 x daily - 7 x weekly - 2 reps - 15 hold - Clamshell  - 1 x daily - 7 x weekly - 2 sets - 10 reps - Seated Upper Trapezius Stretch  - 1 x daily - 7 x weekly - 2 reps - 30 hold  ASSESSMENT:  CLINICAL IMPRESSION: Patient is a 68 y.o. female who was seen today for physical therapy treatment for chronic knee and low back pain.  Reports increase pain in the knees compared to the back. Will continue with another month of therapy as she has not met her goals yet. We spoke about possibly getting gel shots to help with the pain. They will contact the doctor today about this. Had difficulty with step overs on 4 step and was unable to continue, also unable to do regular step ups on 6.  Will continue to benefit from ongoing strengthening and stretching to help with back and knee pain.  OBJECTIVE IMPAIRMENTS: difficulty walking, decreased ROM, decreased strength, impaired flexibility, improper body mechanics, postural dysfunction, and pain.   ACTIVITY LIMITATIONS: standing, squatting, stairs, and locomotion  level  REHAB POTENTIAL: Good  CLINICAL DECISION MAKING: Stable/uncomplicated  EVALUATION COMPLEXITY: Low   GOALS: Goals reviewed with patient? Yes  SHORT TERM GOALS: Target date: 09/06/23  Patient will be independent with initial HEP.  Goal status: MET   LONG TERM GOALS: Target date: 01/10/24  Patient will be independent with advanced/ongoing HEP to improve outcomes and carryover.  Goal status: progressing 10/19/23  2.  Patient will report 75% improvement in low back pain to improve QOL.  Baseline: 9/10 Goal status: progressing 10/24/23, 50% ongoing 11/29/23  3.  Patient will demonstrate full pain free lumbar ROM to perform ADLs.   Baseline: see chart Goal  status: MET 09/27/23  4.  Patient will tolerate 30 min of (standing/sitting/walking) to perform household chores. Baseline: needs rest breaks while cooking and cleaning d/t pain Goal status: progressing 11/03/23, ongoing 11/29/23   5.  Patient will be able to go up and down stairs without pain Baseline: difficulty with stairs  Goal status: progressing 11/03/23, ongoing, avoids steps if she can 11/29/23  PLAN:  PT FREQUENCY: 2x/week  PT DURATION: 10 weeks  PLANNED INTERVENTIONS: Therapeutic exercises, Therapeutic activity, Neuromuscular re-education, Balance training, Gait training, Patient/Family education, Self Care, Joint mobilization, Stair training, Dry Needling, Electrical stimulation, Spinal manipulation, Spinal mobilization, Cryotherapy, Moist heat, and Manual therapy.  PLAN FOR NEXT SESSION: in the next week will need to assess and ask for more visits if needed  Almetta Fam, PT 11/29/2023, 1:55 PM

## 2023-11-30 NOTE — Therapy (Addendum)
OUTPATIENT PHYSICAL THERAPY THORACOLUMBAR TREATMENT Progress Note Reporting Period 09/26/24 to 12/01/23  See note below for Objective Data and Assessment of Progress/Goals.     Patient Name: Casey Powell MRN: [id number] DOB:1956/10/01, 68 y.o., female Today's Date: 12/01/2023  END OF SESSION:  PT End of Session - 12/01/23 1226     Visit Number 20    PT Start Time 1230    PT Stop Time 1315    PT Time Calculation (min) 45 min    Activity Tolerance Patient tolerated treatment well            Past Medical History:  Diagnosis Date   Asthma    GERD (gastroesophageal reflux disease)    Hyperlipidemia    Hypertension    Past Surgical History:  Procedure Laterality Date   ABDOMINAL HYSTERECTOMY     TONSILLECTOMY     Patient Active Problem List   Diagnosis Date Noted   DDD (degenerative disc disease), cervical 01/13/2022   DDD (degenerative disc disease), lumbar 01/13/2022   Primary osteoarthritis of both knees 11/30/2021   Chronic right shoulder pain 11/30/2021   DDD (degenerative disc disease), thoracic 11/30/2021    PCP: Gwinda Passe  REFERRING PROVIDER: Rodney Langton  REFERRING DIAG:  M51.36 (ICD-10-CM) - DDD (degenerative disc disease), lumbar    Rationale for Evaluation and Treatment: Rehabilitation  THERAPY DIAG:  Muscle weakness (generalized)  Other low back pain  Chronic pain of right knee  Chronic pain of left knee  Mid back pain, chronic  ONSET DATE: 07/04/23  SUBJECTIVE:                                                                                                                                                                                           SUBJECTIVE STATEMENT: Felt good with less pain than for previous session.   Daughter is present at visit to translate  PERTINENT HISTORY:  Known lumbar DDD with a good-sized L3-L4 disc protrusion on MRI  PAIN:  Are you having pain? Yes: NPRS scale: 4/10 Pain location:  back, knees, hips, neck, and shoulders Pain description: back feels like someone broke it, neck and down leg is just tight  Aggravating factors: standing, stress, walking far distances  Relieving factors: pain meds, shots in shoulder and knees   PRECAUTIONS: None  RED FLAGS: None   WEIGHT BEARING RESTRICTIONS: No  FALLS:  Has patient fallen in last 6 months? No  LIVING ENVIRONMENT: Lives with: lives with their family Lives in: House/apartment Stairs: Yes: Internal: 15 steps; on right going up and External: 3 steps; none Has following equipment at home: None  OCCUPATION: Not working  PLOF: Independent  PATIENT GOALS: be able to climb stairs easier, lift up arms better, ease the back pain   NEXT MD VISIT: 08/16/23  OBJECTIVE:   DIAGNOSTIC FINDINGS:  Mild degenerative changes, without spinal canal stenosis or neural foraminal narrowing in the cervical, thoracic, or lumbar spine.  COGNITION: Overall cognitive status: Within functional limits for tasks assessed     SENSATION: WFL  MUSCLE LENGTH: Hamstrings: mild tightness bilaterally   POSTURE: rounded shoulders, forward head, and increased thoracic kyphosis  PALPATION: TTP L4-L5 and SIJ   LUMBAR ROM:   AROM eval 09/27/23  Flexion Able to touch toes   Extension 50%  WFL  Right lateral flexion Mid thigh with pain WFL  Left lateral flexion Mid thigh with pain WFL  Right rotation WFL   Left rotation WFL    (Blank rows = not tested)  LOWER EXTREMITY ROM:  WNL   LOWER EXTREMITY MMT:    MMT Right eval Left eval  Hip flexion 5 5  Hip extension    Hip abduction 4 4  Hip adduction 5 5  Hip internal rotation    Hip external rotation    Knee flexion 5 5  Knee extension 4 4  Ankle dorsiflexion    Ankle plantarflexion    Ankle inversion    Ankle eversion     (Blank rows = not tested)  LUMBAR SPECIAL TESTS:  Straight leg raise test: Positive and FABER test: Positive  FUNCTIONAL TESTS:  5 times sit to  stand: 12.58s Timed up and go (TUG): 12.35s  TODAY'S TREATMENT:                                                                                                                              DATE:  12/01/23 NuStep L5x45mins Calf Raises 2x10  Hip Abduction green band 2x10 - easy LAQ 2# 2x12 each leg HS curls 20# 2x10  Step Ups 4 in 2x10 each leg leading Step ups onto airex 2x10 Side steps onto airex 2x10  11/29/23 Recert, recheck goals  Leg press 20# 2x10 Calf raises 2x12 NuStep L5x58mins  5xSTS 16.46s  Resisted gait 20# 4 way x4  Step ups 4"   11/24/23 NuStep L5x24mins Step ups 4" Lateral step ups 4"  Leg ext 10# 2x12 HS curls 20# 2x12  Feet on ball K2C, rotation, bridge and isometric abs Stretching for HS, knees to chest  11/22/23 Nustep level 5 x 7 minutes Tried the bike but his was too much pain with the bend on the right knee 15# Leg curls 2x12 10# rows 2x12 5# straight arm pulls cues to engage core 5# hip extension Feet on ball K2C, rotation, bridge and isometric abs Passive stretch LE's STM to the low back into the upper traps  11/03/23 Nustep level 5 x 7 minutes 20# leg curls 2x10 5# hip extension and abduction 5# straight arm pulls 10# AR press Feet on ball K2C, rotation, bridge, isometric abs Passive  LE stretches STM with the T-gun to the hips posterior and laterally  11/01/23 Leg curls 20# 2x10 Standing 5# straight arm pulls Nustep level 4 x 6 minutes 5# hip extension, abduction and SLR On airex ball toss, head turns On airex cone toe touch Feet on ball K2C, rotation, small bridge Passive LE stretches STM with tht eT-gun to the hips and low back due to the increased pain  10/27/23 Calf raises 2x12  Resisted gait 20# 4 way x4 NuStep L5 x28mins  Leg press 20# 2x10 Seated rows and lat pulls 20# 2x10  LE stretching  Walking on beam  Tandem hold on beam   10/25/23 Nustep level 5 x 6 minutes 5# hip extension 5# hip abuction 5# straight arm  pulls on airex On airex volley ball Leg press 20# 2x10 Calf stretches Lats 15# Feet on ball K2C, rotation, bridge and isometric abs LE strethces  10/20/23 Nustep level 5 x 6 mintues 15# rows 2x10 15# lats 2x10 5# AR press 2x10 Leg curls 20# 2x10 On airex balance activities ball toss, head turns, eyes closed Feet on ball K2C, rotation, bridge and isometric abs  09/29/23 STS on airex 2x10 NuStep L5x75mins Step ups 6" Calf stretch 30s on slant  Walking on beam Leg ext 10# 2x12 HS curls 25# 2x12 Shoulder ext 5# 2x10  Cable rows 5# 2x10  PATIENT EDUCATION:  Education details: POC and HEP Person educated: Patient Education method: Explanation Education comprehension: verbalized understanding  HOME EXERCISE PROGRAM: Access Code: [email or web address] URL: [email or web address] Date: 08/02/2023 Prepared by: Cassie Freer  Exercises - Supine Lower Trunk Rotation  - 1 x daily - 7 x weekly - 2 sets - 10 reps - Supine Bridge  - 1 x daily - 7 x weekly - 2 sets - 10 reps - Supine Single Knee to Chest Stretch  - 1 x daily - 7 x weekly - 2 reps - 15 hold - Clamshell  - 1 x daily - 7 x weekly - 2 sets - 10 reps - Seated Upper Trapezius Stretch  - 1 x daily - 7 x weekly - 2 reps - 30 hold  ASSESSMENT:  CLINICAL IMPRESSION: Patient is a 68 y.o. female who was seen today for physical therapy treatment for chronic knee and low back pain.   Pt reported having less knee pain after last session. She tolerated the session well, felt that her muscles were being worked without causing any knee pain. She was able to do the 4 inch step well leading with each foot and should be able to progress next session. Pt will continue to benefit from skilled PT to strengthen LE to better support knee and low back.   OBJECTIVE IMPAIRMENTS: difficulty walking, decreased ROM, decreased strength, impaired flexibility, improper body mechanics, postural dysfunction, and pain.   ACTIVITY LIMITATIONS: standing,  squatting, stairs, and locomotion level  REHAB POTENTIAL: Good  CLINICAL DECISION MAKING: Stable/uncomplicated  EVALUATION COMPLEXITY: Low   GOALS: Goals reviewed with patient? Yes  SHORT TERM GOALS: Target date: 09/06/23  Patient will be independent with initial HEP.  Goal status: MET   LONG TERM GOALS: Target date: 01/10/24  Patient will be independent with advanced/ongoing HEP to improve outcomes and carryover.  Goal status: progressing 10/19/23  2.  Patient will report 75% improvement in low back pain to improve QOL.  Baseline: 9/10 Goal status: progressing 10/24/23, 50% ongoing 11/29/23  3.  Patient will demonstrate full pain free lumbar ROM to perform ADLs.   Baseline: see chart  Goal status: MET 09/27/23  4.  Patient will tolerate 30 min of (standing/sitting/walking) to perform household chores. Baseline: needs rest breaks while cooking and cleaning d/t pain Goal status: progressing 11/03/23, ongoing 11/29/23   5.  Patient will be able to go up and down stairs without pain Baseline: difficulty with stairs  Goal status: progressing 11/03/23, ongoing, avoids steps if she can 11/29/23  PLAN:  PT FREQUENCY: 2x/week  PT DURATION: 10 weeks  PLANNED INTERVENTIONS: Therapeutic exercises, Therapeutic activity, Neuromuscular re-education, Balance training, Gait training, Patient/Family education, Self Care, Joint mobilization, Stair training, Dry Needling, Electrical stimulation, Spinal manipulation, Spinal mobilization, Cryotherapy, Moist heat, and Manual therapy.  PLAN FOR NEXT SESSION: in the next week will need to assess and ask for more visits if needed  Pascal Lux, Student-PT 12/01/2023, 1:10 PM

## 2023-12-01 ENCOUNTER — Ambulatory Visit: Payer: Medicaid Other

## 2023-12-01 DIAGNOSIS — G8929 Other chronic pain: Secondary | ICD-10-CM

## 2023-12-01 DIAGNOSIS — M5459 Other low back pain: Secondary | ICD-10-CM

## 2023-12-01 DIAGNOSIS — M6281 Muscle weakness (generalized): Secondary | ICD-10-CM

## 2023-12-01 DIAGNOSIS — M549 Dorsalgia, unspecified: Secondary | ICD-10-CM

## 2023-12-01 DIAGNOSIS — M25561 Pain in right knee: Secondary | ICD-10-CM | POA: Diagnosis not present

## 2023-12-06 ENCOUNTER — Ambulatory Visit: Payer: Medicaid Other

## 2023-12-08 ENCOUNTER — Ambulatory Visit: Payer: Medicaid Other

## 2023-12-08 ENCOUNTER — Other Ambulatory Visit (INDEPENDENT_AMBULATORY_CARE_PROVIDER_SITE_OTHER): Payer: Medicaid Other

## 2023-12-08 ENCOUNTER — Ambulatory Visit: Payer: Medicaid Other | Admitting: Sports Medicine

## 2023-12-08 ENCOUNTER — Telehealth: Payer: Self-pay | Admitting: Sports Medicine

## 2023-12-08 DIAGNOSIS — M170 Bilateral primary osteoarthritis of knee: Secondary | ICD-10-CM

## 2023-12-08 MED ORDER — TRIAMCINOLONE ACETONIDE 40 MG/ML IJ SUSP
80.0000 mg | Freq: Once | INTRAMUSCULAR | Status: AC
  Administered 2023-12-08: 80 mg via INTRAMUSCULAR

## 2023-12-08 NOTE — Assessment & Plan Note (Signed)
Very pleasant 68 year old female, last injection was several years ago, now having recurrence of pain, unfortunately not doing better with hydrocodone, Tylenol. Not better after 6 weeks of physical therapy. Today we did bilateral steroid injections and we we will work on doing the approval for bilateral Visco. Return to start Visco when approved.

## 2023-12-08 NOTE — Addendum Note (Signed)
Addended by: Holland Falling on: 12/08/2023 04:36 PM   Modules accepted: Orders

## 2023-12-08 NOTE — Code Documentation (Signed)
done

## 2023-12-08 NOTE — Progress Notes (Signed)
    Procedures performed today:    Procedure: Real-time Ultrasound Guided injection of the left knee Device: Samsung HS60  Verbal informed consent obtained.  Time-out conducted.  Noted no overlying erythema, induration, or other signs of local infection.  Skin prepped in a sterile fashion.  Local anesthesia: Topical Ethyl chloride.  With sterile technique and under real time ultrasound guidance: Mild effusion noted, 1 cc Kenalog 40, 2 cc lidocaine, 2 cc bupivacaine injected easily Completed without difficulty  Advised to call if fevers/chills, erythema, induration, drainage, or persistent bleeding.  Images permanently stored and available for review in PACS.  Impression: Technically successful ultrasound guided injection.   Procedure: Real-time Ultrasound Guided injection of the right knee Device: Samsung HS60  Verbal informed consent obtained.  Time-out conducted.  Noted no overlying erythema, induration, or other signs of local infection.  Skin prepped in a sterile fashion.  Local anesthesia: Topical Ethyl chloride.  With sterile technique and under real time ultrasound guidance: Mild effusion noted, 1 cc Kenalog 40, 2 cc lidocaine, 2 cc bupivacaine injected easily Completed without difficulty  Advised to call if fevers/chills, erythema, induration, drainage, or persistent bleeding.  Images permanently stored and available for review in PACS.  Impression: Technically successful ultrasound guided injection.  Independent interpretation of notes and tests performed by another provider:   None.  Brief History, Exam, Impression, and Recommendations:    Primary osteoarthritis of both knees Very pleasant 68 year old female, last injection was several years ago, now having recurrence of pain, unfortunately not doing better with hydrocodone, Tylenol. Not better after 6 weeks of physical therapy. Today we did bilateral steroid injections and we we will work on doing the approval for  bilateral Visco. Return to start Visco when approved.    ____________________________________________ Ihor Austin. Benjamin Stain, M.D., ABFM., CAQSM., AME. Primary Care and Sports Medicine [facility]  Adjunct Professor of Family Medicine  [employer]  Restaurant manager, fast food

## 2023-12-08 NOTE — Telephone Encounter (Signed)
Visco approval please, bilateral x-ray confirmed osteoarthritis

## 2023-12-09 NOTE — Telephone Encounter (Signed)
PA information submitted via [email or web address] for [facility] Paperwork has been printed and given to Dr. Karie Schwalbe for signatures. Once obtained, information will be faxed to [facility] at [phone]

## 2023-12-12 NOTE — Therapy (Incomplete)
OUTPATIENT PHYSICAL THERAPY THORACOLUMBAR TREATMENT Progress Note Reporting Period 09/26/24 to 12/01/23  See note below for Objective Data and Assessment of Progress/Goals.     Patient Name: Casey Powell MRN: [id number] DOB:May 11, 1956, 68 y.o., female Today's Date: 12/12/2023  END OF SESSION:   Past Medical History:  Diagnosis Date   Asthma    GERD (gastroesophageal reflux disease)    Hyperlipidemia    Hypertension    Past Surgical History:  Procedure Laterality Date   ABDOMINAL HYSTERECTOMY     TONSILLECTOMY     Patient Active Problem List   Diagnosis Date Noted   DDD (degenerative disc disease), cervical 01/13/2022   DDD (degenerative disc disease), lumbar 01/13/2022   Primary osteoarthritis of both knees 11/30/2021   Chronic right shoulder pain 11/30/2021   DDD (degenerative disc disease), thoracic 11/30/2021    PCP: Gwinda Passe  REFERRING PROVIDER: Rodney Langton  REFERRING DIAG:  M51.36 (ICD-10-CM) - DDD (degenerative disc disease), lumbar    Rationale for Evaluation and Treatment: Rehabilitation  THERAPY DIAG:  No diagnosis found.  ONSET DATE: 07/04/23  SUBJECTIVE:                                                                                                                                                                                           SUBJECTIVE STATEMENT: Felt good with less pain than for previous session.   Daughter is present at visit to translate  PERTINENT HISTORY:  Known lumbar DDD with a good-sized L3-L4 disc protrusion on MRI  PAIN:  Are you having pain? Yes: NPRS scale: 4/10 Pain location: back, knees, hips, neck, and shoulders Pain description: back feels like someone broke it, neck and down leg is just tight  Aggravating factors: standing, stress, walking far distances  Relieving factors: pain meds, shots in shoulder and knees   PRECAUTIONS: None  RED FLAGS: None   WEIGHT BEARING RESTRICTIONS:  No  FALLS:  Has patient fallen in last 6 months? No  LIVING ENVIRONMENT: Lives with: lives with their family Lives in: House/apartment Stairs: Yes: Internal: 15 steps; on right going up and External: 3 steps; none Has following equipment at home: None  OCCUPATION: Not working  PLOF: Independent  PATIENT GOALS: be able to climb stairs easier, lift up arms better, ease the back pain   NEXT MD VISIT: 08/16/23  OBJECTIVE:   DIAGNOSTIC FINDINGS:  Mild degenerative changes, without spinal canal stenosis or neural foraminal narrowing in the cervical, thoracic, or lumbar spine.  COGNITION: Overall cognitive status: Within functional limits for tasks assessed     SENSATION: WFL  MUSCLE LENGTH: Hamstrings: mild tightness bilaterally   POSTURE: rounded  shoulders, forward head, and increased thoracic kyphosis  PALPATION: TTP L4-L5 and SIJ   LUMBAR ROM:   AROM eval 09/27/23  Flexion Able to touch toes   Extension 50%  WFL  Right lateral flexion Mid thigh with pain WFL  Left lateral flexion Mid thigh with pain WFL  Right rotation WFL   Left rotation WFL    (Blank rows = not tested)  LOWER EXTREMITY ROM:  WNL   LOWER EXTREMITY MMT:    MMT Right eval Left eval  Hip flexion 5 5  Hip extension    Hip abduction 4 4  Hip adduction 5 5  Hip internal rotation    Hip external rotation    Knee flexion 5 5  Knee extension 4 4  Ankle dorsiflexion    Ankle plantarflexion    Ankle inversion    Ankle eversion     (Blank rows = not tested)  LUMBAR SPECIAL TESTS:  Straight leg raise test: Positive and FABER test: Positive  FUNCTIONAL TESTS:  5 times sit to stand: 12.58s Timed up and go (TUG): 12.35s  TODAY'S TREATMENT:                                                                                                                              DATE:  12/12/23: NuStep L5x76mins LAQ 3# Banded Side Steps 6 inch step ups Resisted Gait 4 way - x4   12/01/23 NuStep  L5x74mins Calf Raises 2x10  Hip Abduction green band 2x10 - easy LAQ 2# 2x12 each leg HS curls 20# 2x10  Step Ups 4 in 2x10 each leg leading Step ups onto airex 2x10 Side steps onto airex 2x10  11/29/23 Recert, recheck goals  Leg press 20# 2x10 Calf raises 2x12 NuStep L5x49mins  5xSTS 16.46s  Resisted gait 20# 4 way x4  Step ups 4"   11/24/23 NuStep L5x1mins Step ups 4" Lateral step ups 4"  Leg ext 10# 2x12 HS curls 20# 2x12  Feet on ball K2C, rotation, bridge and isometric abs Stretching for HS, knees to chest  11/22/23 Nustep level 5 x 7 minutes Tried the bike but his was too much pain with the bend on the right knee 15# Leg curls 2x12 10# rows 2x12 5# straight arm pulls cues to engage core 5# hip extension Feet on ball K2C, rotation, bridge and isometric abs Passive stretch LE's STM to the low back into the upper traps  11/03/23 Nustep level 5 x 7 minutes 20# leg curls 2x10 5# hip extension and abduction 5# straight arm pulls 10# AR press Feet on ball K2C, rotation, bridge, isometric abs Passive LE stretches STM with the T-gun to the hips posterior and laterally  11/01/23 Leg curls 20# 2x10 Standing 5# straight arm pulls Nustep level 4 x 6 minutes 5# hip extension, abduction and SLR On airex ball toss, head turns On airex cone toe touch Feet on ball K2C, rotation, small bridge Passive LE stretches STM with  tht eT-gun to the hips and low back due to the increased pain  10/27/23 Calf raises 2x12  Resisted gait 20# 4 way x4 NuStep L5 x72mins  Leg press 20# 2x10 Seated rows and lat pulls 20# 2x10  LE stretching  Walking on beam  Tandem hold on beam   10/25/23 Nustep level 5 x 6 minutes 5# hip extension 5# hip abuction 5# straight arm pulls on airex On airex volley ball Leg press 20# 2x10 Calf stretches Lats 15# Feet on ball K2C, rotation, bridge and isometric abs LE strethces  10/20/23 Nustep level 5 x 6 mintues 15# rows 2x10 15# lats  2x10 5# AR press 2x10 Leg curls 20# 2x10 On airex balance activities ball toss, head turns, eyes closed Feet on ball K2C, rotation, bridge and isometric abs  09/29/23 STS on airex 2x10 NuStep L5x58mins Step ups 6" Calf stretch 30s on slant  Walking on beam Leg ext 10# 2x12 HS curls 25# 2x12 Shoulder ext 5# 2x10  Cable rows 5# 2x10  PATIENT EDUCATION:  Education details: POC and HEP Person educated: Patient Education method: Explanation Education comprehension: verbalized understanding  HOME EXERCISE PROGRAM: Access Code: [email or web address] URL: [email or web address] Date: 08/02/2023 Prepared by: Cassie Freer  Exercises - Supine Lower Trunk Rotation  - 1 x daily - 7 x weekly - 2 sets - 10 reps - Supine Bridge  - 1 x daily - 7 x weekly - 2 sets - 10 reps - Supine Single Knee to Chest Stretch  - 1 x daily - 7 x weekly - 2 reps - 15 hold - Clamshell  - 1 x daily - 7 x weekly - 2 sets - 10 reps - Seated Upper Trapezius Stretch  - 1 x daily - 7 x weekly - 2 reps - 30 hold  ASSESSMENT:  CLINICAL IMPRESSION: Patient is a 68 y.o. female who was seen today for physical therapy treatment for chronic knee and low back pain.   Pt reported having less knee pain after last session. She tolerated the session well, felt that her muscles were being worked without causing any knee pain. She was able to do the 4 inch step well leading with each foot and should be able to progress next session. Pt will continue to benefit from skilled PT to strengthen LE to better support knee and low back.   OBJECTIVE IMPAIRMENTS: difficulty walking, decreased ROM, decreased strength, impaired flexibility, improper body mechanics, postural dysfunction, and pain.   ACTIVITY LIMITATIONS: standing, squatting, stairs, and locomotion level  REHAB POTENTIAL: Good  CLINICAL DECISION MAKING: Stable/uncomplicated  EVALUATION COMPLEXITY: Low   GOALS: Goals reviewed with patient? Yes  SHORT TERM GOALS:  Target date: 09/06/23  Patient will be independent with initial HEP.  Goal status: MET   LONG TERM GOALS: Target date: 01/10/24  Patient will be independent with advanced/ongoing HEP to improve outcomes and carryover.  Goal status: progressing 10/19/23  2.  Patient will report 75% improvement in low back pain to improve QOL.  Baseline: 9/10 Goal status: progressing 10/24/23, 50% ongoing 11/29/23  3.  Patient will demonstrate full pain free lumbar ROM to perform ADLs.   Baseline: see chart Goal status: MET 09/27/23  4.  Patient will tolerate 30 min of (standing/sitting/walking) to perform household chores. Baseline: needs rest breaks while cooking and cleaning d/t pain Goal status: progressing 11/03/23, ongoing 11/29/23   5.  Patient will be able to go up and down stairs without pain Baseline: difficulty with stairs  Goal status: progressing  11/03/23, ongoing, avoids steps if she can 11/29/23  PLAN:  PT FREQUENCY: 2x/week  PT DURATION: 10 weeks  PLANNED INTERVENTIONS: Therapeutic exercises, Therapeutic activity, Neuromuscular re-education, Balance training, Gait training, Patient/Family education, Self Care, Joint mobilization, Stair training, Dry Needling, Electrical stimulation, Spinal manipulation, Spinal mobilization, Cryotherapy, Moist heat, and Manual therapy.  PLAN FOR NEXT SESSION: in the next week will need to assess and ask for more visits if needed  Pascal Lux, Student-PT 12/12/2023, 4:32 PM

## 2023-12-13 ENCOUNTER — Ambulatory Visit: Payer: Medicaid Other

## 2023-12-13 DIAGNOSIS — G8929 Other chronic pain: Secondary | ICD-10-CM

## 2023-12-13 DIAGNOSIS — M549 Dorsalgia, unspecified: Secondary | ICD-10-CM

## 2023-12-13 DIAGNOSIS — M6281 Muscle weakness (generalized): Secondary | ICD-10-CM

## 2023-12-13 DIAGNOSIS — M5459 Other low back pain: Secondary | ICD-10-CM

## 2023-12-13 DIAGNOSIS — R29898 Other symptoms and signs involving the musculoskeletal system: Secondary | ICD-10-CM

## 2023-12-13 DIAGNOSIS — M25561 Pain in right knee: Secondary | ICD-10-CM | POA: Diagnosis not present

## 2023-12-15 ENCOUNTER — Ambulatory Visit: Payer: Medicaid Other

## 2023-12-15 DIAGNOSIS — M5459 Other low back pain: Secondary | ICD-10-CM

## 2023-12-15 DIAGNOSIS — M25561 Pain in right knee: Secondary | ICD-10-CM | POA: Diagnosis not present

## 2023-12-15 DIAGNOSIS — M6281 Muscle weakness (generalized): Secondary | ICD-10-CM

## 2023-12-15 DIAGNOSIS — G8929 Other chronic pain: Secondary | ICD-10-CM

## 2023-12-15 NOTE — Therapy (Signed)
OUTPATIENT PHYSICAL THERAPY THORACOLUMBAR TREATMENT   Patient Name: Casey Powell MRN: [id number] DOB:12/31/1955, 68 y.o., female Today's Date: 12/15/2023  END OF SESSION:   Past Medical History:  Diagnosis Date   Asthma    GERD (gastroesophageal reflux disease)    Hyperlipidemia    Hypertension    Past Surgical History:  Procedure Laterality Date   ABDOMINAL HYSTERECTOMY     TONSILLECTOMY     Patient Active Problem List   Diagnosis Date Noted   DDD (degenerative disc disease), cervical 01/13/2022   DDD (degenerative disc disease), lumbar 01/13/2022   Primary osteoarthritis of both knees 11/30/2021   Chronic right shoulder pain 11/30/2021   DDD (degenerative disc disease), thoracic 11/30/2021    PCP: Gwinda Passe  REFERRING PROVIDER: Rodney Langton  REFERRING DIAG:  M51.36 (ICD-10-CM) - DDD (degenerative disc disease), lumbar    Rationale for Evaluation and Treatment: Rehabilitation  THERAPY DIAG:  No diagnosis found.  ONSET DATE: 07/04/23  SUBJECTIVE:                                                                                                                                                                                           SUBJECTIVE STATEMENT: Pt stated she felt okay, but continued to have right knee pain along with some neck and shoulder pain. She did not have any soreness after the previous session.   Daughter is present at visit to translate  PERTINENT HISTORY:  Known lumbar DDD with a good-sized L3-L4 disc protrusion on MRI  PAIN:  Are you having pain? Yes: NPRS scale: 3/10 (left knee), 5-6/10 (right knee), 4-5/10 (back and shoulders) Pain location: back, knees, hips, neck, and shoulders Pain description: back feels like someone broke it, neck and down leg is just tight  Aggravating factors: standing, stress, walking far distances  Relieving factors: pain meds, shots in shoulder and knees   PRECAUTIONS: None  RED  FLAGS: None   WEIGHT BEARING RESTRICTIONS: No  FALLS:  Has patient fallen in last 6 months? No  LIVING ENVIRONMENT: Lives with: lives with their family Lives in: House/apartment Stairs: Yes: Internal: 15 steps; on right going up and External: 3 steps; none Has following equipment at home: None  OCCUPATION: Not working  PLOF: Independent  PATIENT GOALS: be able to climb stairs easier, lift up arms better, ease the back pain   NEXT MD VISIT: 08/16/23  OBJECTIVE:   DIAGNOSTIC FINDINGS:  Mild degenerative changes, without spinal canal stenosis or neural foraminal narrowing in the cervical, thoracic, or lumbar spine.  COGNITION: Overall cognitive status: Within functional limits for tasks assessed     SENSATION: [facility]  MUSCLE  LENGTH: Hamstrings: mild tightness bilaterally   POSTURE: rounded shoulders, forward head, and increased thoracic kyphosis  PALPATION: TTP L4-L5 and SIJ   LUMBAR ROM:   AROM eval 09/27/23  Flexion Able to touch toes   Extension 50%  WFL  Right lateral flexion Mid thigh with pain WFL  Left lateral flexion Mid thigh with pain WFL  Right rotation WFL   Left rotation WFL    (Blank rows = not tested)  LOWER EXTREMITY ROM:  WNL   LOWER EXTREMITY MMT:    MMT Right eval Left eval  Hip flexion 5 5  Hip extension    Hip abduction 4 4  Hip adduction 5 5  Hip internal rotation    Hip external rotation    Knee flexion 5 5  Knee extension 4 4  Ankle dorsiflexion    Ankle plantarflexion    Ankle inversion    Ankle eversion     (Blank rows = not tested)  LUMBAR SPECIAL TESTS:  Straight leg raise test: Positive and FABER test: Positive  FUNCTIONAL TESTS:  5 times sit to stand: 12.58s Timed up and go (TUG): 12.35s  TODAY'S TREATMENT:                                                                                                                              DATE:  12/15/23 NuStep L5x42mins 6 inch step ups from airex pad 2x10 2 inch step  overs 3x5 each leg Resisted gait 20# 4 way x4 Banded STS 2 x 12 Light Neck Stretching, side bend and levator scap  Theragun to neck and scapular retractors  12/13/23: NuStep L5x62mins LAQ 5# 2x10 each HS Curls 2x10 20# 6 inch step ups 2x10 4 inch side steps 2x10 Banded Side Steps 2x10 each way Banded STS 2x12 Bridges 2x10  Supine Trunk Rotations 2x10  12/01/23 NuStep L5x69mins Calf Raises 2x10  Hip Abduction green band 2x10 - easy LAQ 2# 2x12 each leg HS curls 20# 2x10  Step Ups 4 in 2x10 each leg leading Step ups onto airex 2x10 Side steps onto airex 2x10  11/29/23 Recert, recheck goals  Leg press 20# 2x10 Calf raises 2x12 NuStep L5x29mins  5xSTS 16.46s  Resisted gait 20# 4 way x4  Step ups 4"  11/24/23 NuStep L5x48mins Step ups 4" Lateral step ups 4"  Leg ext 10# 2x12 HS curls 20# 2x12  Feet on ball K2C, rotation, bridge and isometric abs Stretching for HS, knees to chest  11/22/23 Nustep level 5 x 7 minutes Tried the bike but his was too much pain with the bend on the right knee 15# Leg curls 2x12 10# rows 2x12 5# straight arm pulls cues to engage core 5# hip extension Feet on ball K2C, rotation, bridge and isometric abs Passive stretch LE's STM to the low back into the upper traps  11/03/23 Nustep level 5 x 7 minutes 20# leg curls 2x10 5# hip extension and abduction 5# straight arm  pulls 10# AR press Feet on ball K2C, rotation, bridge, isometric abs Passive LE stretches STM with the T-gun to the hips posterior and laterally  11/01/23 Leg curls 20# 2x10 Standing 5# straight arm pulls Nustep level 4 x 6 minutes 5# hip extension, abduction and SLR On airex ball toss, head turns On airex cone toe touch Feet on ball K2C, rotation, small bridge Passive LE stretches STM with tht eT-gun to the hips and low back due to the increased pain  10/27/23 Calf raises 2x12  Resisted gait 20# 4 way x4 NuStep L5 x70mins  Leg press 20# 2x10 Seated rows and lat  pulls 20# 2x10  LE stretching  Walking on beam  Tandem hold on beam   10/25/23 Nustep level 5 x 6 minutes 5# hip extension 5# hip abuction 5# straight arm pulls on airex On airex volley ball Leg press 20# 2x10 Calf stretches Lats 15# Feet on ball K2C, rotation, bridge and isometric abs LE strethces  10/20/23 Nustep level 5 x 6 mintues 15# rows 2x10 15# lats 2x10 5# AR press 2x10 Leg curls 20# 2x10 On airex balance activities ball toss, head turns, eyes closed Feet on ball K2C, rotation, bridge and isometric abs  09/29/23 STS on airex 2x10 NuStep L5x83mins Step ups 6" Calf stretch 30s on slant  Walking on beam Leg ext 10# 2x12 HS curls 25# 2x12 Shoulder ext 5# 2x10  Cable rows 5# 2x10  PATIENT EDUCATION:  Education details: POC and HEP Person educated: Patient Education method: Explanation Education comprehension: verbalized understanding  HOME EXERCISE PROGRAM: Access Code: [email or web address] URL: [email or web address] Date: 08/02/2023 Prepared by: Cassie Freer  Exercises - Supine Lower Trunk Rotation  - 1 x daily - 7 x weekly - 2 sets - 10 reps - Supine Bridge  - 1 x daily - 7 x weekly - 2 sets - 10 reps - Supine Single Knee to Chest Stretch  - 1 x daily - 7 x weekly - 2 reps - 15 hold - Clamshell  - 1 x daily - 7 x weekly - 2 sets - 10 reps - Seated Upper Trapezius Stretch  - 1 x daily - 7 x weekly - 2 reps - 30 hold  ASSESSMENT:  CLINICAL IMPRESSION: Patient is a 68 y.o. female who was seen today for physical therapy treatment for chronic knee and low back pain.   Pt reported a similar knee pain to last session. She was not sore after last session, but did not some neck and upper back pain. She was able to complete the 2 inch step overs after having difficulty with the 4 inch step overs last session. We did some light neck stretching and used the theragun to help with the neck and upper back pain. Pt said her neck pain was alleviated some by the manual  therapy. She asked about taking an anti-inflammatory for her knee pain and  was educated on talking to her physician before she started taking any new medication. Pt will continue to benefit from skilled PT to decrease overall pain and increase strength to better support her joints.   OBJECTIVE IMPAIRMENTS: difficulty walking, decreased ROM, decreased strength, impaired flexibility, improper body mechanics, postural dysfunction, and pain.   ACTIVITY LIMITATIONS: standing, squatting, stairs, and locomotion level  REHAB POTENTIAL: Good  CLINICAL DECISION MAKING: Stable/uncomplicated  EVALUATION COMPLEXITY: Low   GOALS: Goals reviewed with patient? Yes  SHORT TERM GOALS: Target date: 09/06/23  Patient will be independent with initial HEP.  Goal status: MET  LONG TERM GOALS: Target date: 01/10/24  Patient will be independent with advanced/ongoing HEP to improve outcomes and carryover.  Goal status: progressing 10/19/23  2.  Patient will report 75% improvement in low back pain to improve QOL.  Baseline: 9/10 Goal status: progressing 10/24/23, 50% ongoing 11/29/23  3.  Patient will demonstrate full pain free lumbar ROM to perform ADLs.   Baseline: see chart Goal status: MET 09/27/23  4.  Patient will tolerate 30 min of (standing/sitting/walking) to perform household chores. Baseline: needs rest breaks while cooking and cleaning d/t pain Goal status: progressing 11/03/23, ongoing 11/29/23   5.  Patient will be able to go up and down stairs without pain Baseline: difficulty with stairs  Goal status: progressing 11/03/23, ongoing, avoids steps if she can 11/29/23  PLAN:  PT FREQUENCY: 2x/week  PT DURATION: 10 weeks  PLANNED INTERVENTIONS: Therapeutic exercises, Therapeutic activity, Neuromuscular re-education, Balance training, Gait training, Patient/Family education, Self Care, Joint mobilization, Stair training, Dry Needling, Electrical stimulation, Spinal manipulation, Spinal  mobilization, Cryotherapy, Moist heat, and Manual therapy.  PLAN FOR NEXT SESSION: in the next week will need to assess and ask for more visits if needed  Pascal Lux, Student-PT 12/15/2023, 11:42 AM

## 2023-12-20 ENCOUNTER — Ambulatory Visit: Payer: Medicaid Other | Attending: Primary Care | Admitting: Physical Therapy

## 2023-12-20 DIAGNOSIS — M549 Dorsalgia, unspecified: Secondary | ICD-10-CM | POA: Insufficient documentation

## 2023-12-20 DIAGNOSIS — R29898 Other symptoms and signs involving the musculoskeletal system: Secondary | ICD-10-CM | POA: Diagnosis present

## 2023-12-20 DIAGNOSIS — M6281 Muscle weakness (generalized): Secondary | ICD-10-CM | POA: Diagnosis present

## 2023-12-20 DIAGNOSIS — M5459 Other low back pain: Secondary | ICD-10-CM | POA: Diagnosis present

## 2023-12-20 DIAGNOSIS — M25562 Pain in left knee: Secondary | ICD-10-CM | POA: Diagnosis present

## 2023-12-20 DIAGNOSIS — M25561 Pain in right knee: Secondary | ICD-10-CM | POA: Insufficient documentation

## 2023-12-20 DIAGNOSIS — G8929 Other chronic pain: Secondary | ICD-10-CM | POA: Insufficient documentation

## 2023-12-20 NOTE — Therapy (Signed)
 OUTPATIENT PHYSICAL THERAPY THORACOLUMBAR TREATMENT    Patient Name: Casey Powell MRN: [id number] DOB:May 10, 1956, 68 y.o., female Today's Date: 12/20/2023  END OF SESSION:  PT End of Session - 12/20/23 1229     Visit Number 23    Date for PT Re-Evaluation 01/10/24    Authorization Type [place] Medicaid    PT Start Time 1230    PT Stop Time 1315    PT Time Calculation (min) 45 min                         Past Medical History:  Diagnosis Date   Asthma    GERD (gastroesophageal reflux disease)    Hyperlipidemia    Hypertension    Past Surgical History:  Procedure Laterality Date   ABDOMINAL HYSTERECTOMY     TONSILLECTOMY     Patient Active Problem List   Diagnosis Date Noted   DDD (degenerative disc disease), cervical 01/13/2022   DDD (degenerative disc disease), lumbar 01/13/2022   Primary osteoarthritis of both knees 11/30/2021   Chronic right shoulder pain 11/30/2021   DDD (degenerative disc disease), thoracic 11/30/2021    PCP: Rosaline Bohr  REFERRING PROVIDER: Debby Petties  REFERRING DIAG:  M51.36 (ICD-10-CM) - DDD (degenerative disc disease), lumbar    Rationale for Evaluation and Treatment: Rehabilitation  THERAPY DIAG:  Muscle weakness (generalized)  Mid back pain, chronic  Chronic pain of left knee  Chronic pain of right knee  Other low back pain  ONSET DATE: 07/04/23  SUBJECTIVE:                                                                                                                                                                                           SUBJECTIVE STATEMENT: Back is bothering me today and BIL knees RT >Left, RT travels down leg  Daughter is present at visit to translate  PERTINENT HISTORY:  Known lumbar DDD with a good-sized L3-L4 disc protrusion on MRI  PAIN:  Are you having pain? Yes: NPRS scale: 5-6/10 Pain location: back, knees Pain description: back feels like someone broke it,  neck and down leg is just tight  Aggravating factors: standing, stress, walking far distances  Relieving factors: pain meds, shots in shoulder and knees   PRECAUTIONS: None  RED FLAGS: None   WEIGHT BEARING RESTRICTIONS: No  FALLS:  Has patient fallen in last 6 months? No  LIVING ENVIRONMENT: Lives with: lives with their family Lives in: House/apartment Stairs: Yes: Internal: 15 steps; on right going up and External: 3 steps; none Has following equipment at home: None  OCCUPATION: Not working  PLOF: Independent  PATIENT GOALS: be able to climb stairs easier, lift up arms better, ease the back pain   NEXT MD VISIT: 08/16/23  OBJECTIVE:   DIAGNOSTIC FINDINGS:  Mild degenerative changes, without spinal canal stenosis or neural foraminal narrowing in the cervical, thoracic, or lumbar spine.  COGNITION: Overall cognitive status: Within functional limits for tasks assessed     SENSATION: WFL  MUSCLE LENGTH: Hamstrings: mild tightness bilaterally   POSTURE: rounded shoulders, forward head, and increased thoracic kyphosis  PALPATION: TTP L4-L5 and SIJ   LUMBAR ROM:   AROM eval 09/27/23  Flexion Able to touch toes   Extension 50%  WFL  Right lateral flexion Mid thigh with pain WFL  Left lateral flexion Mid thigh with pain WFL  Right rotation WFL   Left rotation WFL    (Blank rows = not tested)  LOWER EXTREMITY ROM:  WNL   LOWER EXTREMITY MMT:    MMT Right eval Left eval  Hip flexion 5 5  Hip extension    Hip abduction 4 4  Hip adduction 5 5  Hip internal rotation    Hip external rotation    Knee flexion 5 5  Knee extension 4 4  Ankle dorsiflexion    Ankle plantarflexion    Ankle inversion    Ankle eversion     (Blank rows = not tested)  LUMBAR SPECIAL TESTS:  Straight leg raise test: Positive and FABER test: Positive  FUNCTIONAL TESTS:  5 times sit to stand: 12.58s Timed up and go (TUG): 12.35s   TODAY'S TREATMENT:                                                                                                                               DATE:   12/20/23 Nustep L 4 6 min Seated green tband trunk flex and ext 10 each plus trunk rotation 10 x each way Seated green tband hip adb 15 x Seated green tband hip flexion 2 sets 10 STS on airex 2 sets 5 4 inch step up fwd and laterally 10 x each side with UE support Resisted gait 20# 4 way x4 - increased discomfort laterally Leg press 20# 2x10 Calf raises 2x12 Standing red tband row and shld ext 12 x each    11/28/13 Recert, recheck goals  Leg press 20# 2x10 Calf raises 2x12 NuStep L5x47mins  5xSTS 16.46s  Resisted gait 20# 4 way x4  Step ups 4   11/24/23 NuStep L5x29mins Step ups 4 Lateral step ups 4  Leg ext 10# 2x12 HS curls 20# 2x12  Feet on ball K2C, rotation, bridge and isometric abs Stretching for HS, knees to chest  11/22/23 Nustep level 5 x 7 minutes Tried the bike but his was too much pain with the bend on the right knee 15# Leg curls 2x12 10# rows 2x12 5# straight arm pulls cues to engage core 5# hip extension Feet on ball K2C, rotation, bridge and isometric  abs Passive stretch LE's STM to the low back into the upper traps  11/03/23 Nustep level 5 x 7 minutes 20# leg curls 2x10 5# hip extension and abduction 5# straight arm pulls 10# AR press Feet on ball K2C, rotation, bridge, isometric abs Passive LE stretches STM with the T-gun to the hips posterior and laterally  11/01/23 Leg curls 20# 2x10 Standing 5# straight arm pulls Nustep level 4 x 6 minutes 5# hip extension, abduction and SLR On airex ball toss, head turns On airex cone toe touch Feet on ball K2C, rotation, small bridge Passive LE stretches STM with tht eT-gun to the hips and low back due to the increased pain  10/27/23 Calf raises 2x12  Resisted gait 20# 4 way x4 NuStep L5 x75mins  Leg press 20# 2x10 Seated rows and lat pulls 20# 2x10  LE stretching  Walking on beam   Tandem hold on beam   10/25/23 Nustep level 5 x 6 minutes 5# hip extension 5# hip abuction 5# straight arm pulls on airex On airex volley ball Leg press 20# 2x10 Calf stretches Lats 15# Feet on ball K2C, rotation, bridge and isometric abs LE strethces  10/20/23 Nustep level 5 x 6 mintues 15# rows 2x10 15# lats 2x10 5# AR press 2x10 Leg curls 20# 2x10 On airex balance activities ball toss, head turns, eyes closed Feet on ball K2C, rotation, bridge and isometric abs  09/29/23 STS on airex 2x10 NuStep L5x9mins Step ups 6 Calf stretch 30s on slant  Walking on beam Leg ext 10# 2x12 HS curls 25# 2x12 Shoulder ext 5# 2x10  Cable rows 5# 2x10  PATIENT EDUCATION:  Education details: POC and HEP Person educated: Patient Education method: Explanation Education comprehension: verbalized understanding  HOME EXERCISE PROGRAM: Access Code: [email or web address] URL: [email or web address] Date: 08/02/2023 Prepared by: Almetta Fam  Exercises - Supine Lower Trunk Rotation  - 1 x daily - 7 x weekly - 2 sets - 10 reps - Supine Bridge  - 1 x daily - 7 x weekly - 2 sets - 10 reps - Supine Single Knee to Chest Stretch  - 1 x daily - 7 x weekly - 2 reps - 15 hold - Clamshell  - 1 x daily - 7 x weekly - 2 sets - 10 reps - Seated Upper Trapezius Stretch  - 1 x daily - 7 x weekly - 2 reps - 30 hold  ASSESSMENT:  CLINICAL IMPRESSION: Patient is a 68 y.o. female who was seen today for physical therapy treatment for chronic knee and low back pain.  Reports increase pain in the BIL RT >Left knees and back. Goals assessed and pt is status quo form last session but more back today. Continue to work strength and func to help with pain. Cuing needed with scap stab for back, step ups and resisted gait  OBJECTIVE IMPAIRMENTS: difficulty walking, decreased ROM, decreased strength, impaired flexibility, improper body mechanics, postural dysfunction, and pain.   ACTIVITY LIMITATIONS: standing,  squatting, stairs, and locomotion level  REHAB POTENTIAL: Good  CLINICAL DECISION MAKING: Stable/uncomplicated  EVALUATION COMPLEXITY: Low   GOALS: Goals reviewed with patient? Yes  SHORT TERM GOALS: Target date: 09/06/23  Patient will be independent with initial HEP.  Goal status: MET   LONG TERM GOALS: Target date: 01/10/24  Patient will be independent with advanced/ongoing HEP to improve outcomes and carryover.  Goal status: progressing 10/19/23  progressing 12/20/23  2.  Patient will report 75% improvement in low back pain to improve QOL.  Baseline: 9/10 Goal status: progressing 10/24/23, 50% ongoing 11/29/23  progressing 12/20/23  3.  Patient will demonstrate full pain free lumbar ROM to perform ADLs.   Baseline: see chart Goal status: MET 09/27/23  4.  Patient will tolerate 30 min of (standing/sitting/walking) to perform household chores. Baseline: needs rest breaks while cooking and cleaning d/t pain Goal status: progressing 11/03/23, ongoing 11/29/23   5.  Patient will be able to go up and down stairs without pain Baseline: difficulty with stairs  Goal status: progressing 11/03/23, ongoing, avoids steps if she can 11/29/23 same 12/20/23  PLAN:  PT FREQUENCY: 2x/week  PT DURATION: 10 weeks  PLANNED INTERVENTIONS: Therapeutic exercises, Therapeutic activity, Neuromuscular re-education, Balance training, Gait training, Patient/Family education, Self Care, Joint mobilization, Stair training, Dry Needling, Electrical stimulation, Spinal manipulation, Spinal mobilization, Cryotherapy, Moist heat, and Manual therapy.  PLAN FOR NEXT SESSION: progress with increase strength and func to decrease pain  Ciaira Natividad,ANGIE, PTA 12/20/2023, 12:30 PM [place] [facility] at [facility] [address] [facility] [place], [place], [address] Phone: [phone]   Fax:  [phone]  Patient Details  Name: Larrissa Stivers MRN: [id number] Date of Birth:  [birth year] Referring Provider:  Celestia Rosaline SQUIBB, NP  Encounter Date: 12/20/2023   MARSH SNIFF, PTA 12/20/2023, 12:30 PM  [place] [facility] at [facility] [address] [facility] [place], [place], [address] Phone: [phone]   Fax:  [phone]

## 2023-12-26 ENCOUNTER — Telehealth: Payer: Self-pay

## 2023-12-26 NOTE — Telephone Encounter (Signed)
 Copied from CRM [id number]. Topic: Clinical - Prescription Issue >> Dec 26, 2023  9:49 AM Carlatta H wrote: Reason for CRM: Patients prescription for  orthovisc will be sent back//Medication cost to much so the patient did not pick up//

## 2023-12-27 ENCOUNTER — Ambulatory Visit: Payer: Medicaid Other | Admitting: Physical Therapy

## 2023-12-27 DIAGNOSIS — M5459 Other low back pain: Secondary | ICD-10-CM

## 2023-12-27 DIAGNOSIS — M6281 Muscle weakness (generalized): Secondary | ICD-10-CM

## 2023-12-27 DIAGNOSIS — G8929 Other chronic pain: Secondary | ICD-10-CM

## 2023-12-27 DIAGNOSIS — M549 Dorsalgia, unspecified: Secondary | ICD-10-CM

## 2023-12-27 NOTE — Therapy (Signed)
OUTPATIENT PHYSICAL THERAPY THORACOLUMBAR TREATMENT    Patient Name: Casey Powell MRN: [id number] DOB:04-14-1956, 68 y.o., female Today's Date: 12/27/2023  END OF SESSION:  PT End of Session - 12/27/23 1227     Visit Number 24    Date for PT Re-Evaluation 01/10/24    Authorization Type [place] [employer]    PT Start Time 1230    PT Stop Time 1315    PT Time Calculation (min) 45 min                         Past Medical History:  Diagnosis Date   Asthma    GERD (gastroesophageal reflux disease)    Hyperlipidemia    Hypertension    Past Surgical History:  Procedure Laterality Date   ABDOMINAL HYSTERECTOMY     TONSILLECTOMY     Patient Active Problem List   Diagnosis Date Noted   DDD (degenerative disc disease), cervical 01/13/2022   DDD (degenerative disc disease), lumbar 01/13/2022   Primary osteoarthritis of both knees 11/30/2021   Chronic right shoulder pain 11/30/2021   DDD (degenerative disc disease), thoracic 11/30/2021    PCP: Gwinda Passe  REFERRING PROVIDER: Rodney Langton  REFERRING DIAG:  M51.36 (ICD-10-CM) - DDD (degenerative disc disease), lumbar    Rationale for Evaluation and Treatment: Rehabilitation  THERAPY DIAG:  Muscle weakness (generalized)  Mid back pain, chronic  Chronic pain of left knee  Chronic pain of right knee  Other low back pain  ONSET DATE: 07/04/23  SUBJECTIVE:                                                                                                                                                                                           SUBJECTIVE STATEMENT: Back is bothering me today and BIL knees RT >Left, RT travels down leg,maybe weather. Fine after last session. Pt reports doing HEP  Daughter is present at visit to translate  PERTINENT HISTORY:  Known lumbar DDD with a good-sized L3-L4 disc protrusion on MRI  PAIN:  Are you having pain? Yes: NPRS scale: 5-6/10 Pain location:  back, knees Pain description: back feels like someone broke it, neck and down leg is just tight  Aggravating factors: standing, stress, walking far distances  Relieving factors: pain meds, shots in shoulder and knees   PRECAUTIONS: None  RED FLAGS: None   WEIGHT BEARING RESTRICTIONS: No  FALLS:  Has patient fallen in last 6 months? No  LIVING ENVIRONMENT: Lives with: lives with their family Lives in: House/apartment Stairs: Yes: Internal: 15 steps; on right going up and External: 3 steps; none Has following  equipment at home: None  OCCUPATION: Not working  PLOF: Independent  PATIENT GOALS: be able to climb stairs easier, lift up arms better, ease the back pain   NEXT MD VISIT: 08/16/23  OBJECTIVE:   DIAGNOSTIC FINDINGS:  Mild degenerative changes, without spinal canal stenosis or neural foraminal narrowing in the cervical, thoracic, or lumbar spine.  COGNITION: Overall cognitive status: Within functional limits for tasks assessed     SENSATION: WFL  MUSCLE LENGTH: Hamstrings: mild tightness bilaterally   POSTURE: rounded shoulders, forward head, and increased thoracic kyphosis  PALPATION: TTP L4-L5 and SIJ   LUMBAR ROM:   AROM eval 09/27/23  Flexion Able to touch toes   Extension 50%  WFL  Right lateral flexion Mid thigh with pain WFL  Left lateral flexion Mid thigh with pain WFL  Right rotation WFL   Left rotation WFL    (Blank rows = not tested)  LOWER EXTREMITY ROM:  WNL   LOWER EXTREMITY MMT:    MMT Right eval Left eval  Hip flexion 5 5  Hip extension    Hip abduction 4 4  Hip adduction 5 5  Hip internal rotation    Hip external rotation    Knee flexion 5 5  Knee extension 4 4  Ankle dorsiflexion    Ankle plantarflexion    Ankle inversion    Ankle eversion     (Blank rows = not tested)  LUMBAR SPECIAL TESTS:  Straight leg raise test: Positive and FABER test: Positive  FUNCTIONAL TESTS:  5 times sit to stand: 12.58s Timed up and  go (TUG): 12.35s   TODAY'S TREATMENT:                                                                                                                              DATE:   12/27/23 Nustep L 4 6 min Black tband trunk flex and ext 20 x Seated row and lat pull down 2 sets 10  20# Leg press 20# 3x10 Calf raises 2x12 Standing red tband hip 3 way BIL 10 each-issued as HEP Green tband shld ext,row and ER- issued as HEP STS, LAQ and hip flex -issued as HEP Educ on gel inj shots   12/20/23 Nustep L 4 6 min Seated green tband trunk flex and ext 10 each plus trunk rotation 10 x each way Seated green tband hip adb 15 x Seated green tband hip flexion 2 sets 10 STS on airex 2 sets 5 4 inch step up fwd and laterally 10 x each side with UE support Resisted gait 20# 4 way x4 - increased discomfort laterally Leg press 20# 2x10 Calf raises 2x12 Standing red tband row and shld ext 12 x each    11/28/13 Recert, recheck goals  Leg press 20# 2x10 Calf raises 2x12 NuStep L5x52mins  5xSTS 16.46s  Resisted gait 20# 4 way x4  Step ups 4"   11/24/23 NuStep L5x25mins Step ups 4" Lateral step ups  4"  Leg ext 10# 2x12 HS curls 20# 2x12  Feet on ball K2C, rotation, bridge and isometric abs Stretching for HS, knees to chest  11/22/23 Nustep level 5 x 7 minutes Tried the bike but his was too much pain with the bend on the right knee 15# Leg curls 2x12 10# rows 2x12 5# straight arm pulls cues to engage core 5# hip extension Feet on ball K2C, rotation, bridge and isometric abs Passive stretch LE's STM to the low back into the upper traps  11/03/23 Nustep level 5 x 7 minutes 20# leg curls 2x10 5# hip extension and abduction 5# straight arm pulls 10# AR press Feet on ball K2C, rotation, bridge, isometric abs Passive LE stretches STM with the T-gun to the hips posterior and laterally  11/01/23 Leg curls 20# 2x10 Standing 5# straight arm pulls Nustep level 4 x 6 minutes 5# hip extension,  abduction and SLR On airex ball toss, head turns On airex cone toe touch Feet on ball K2C, rotation, small bridge Passive LE stretches STM with tht eT-gun to the hips and low back due to the increased pain  10/27/23 Calf raises 2x12  Resisted gait 20# 4 way x4 NuStep L5 x3mins  Leg press 20# 2x10 Seated rows and lat pulls 20# 2x10  LE stretching  Walking on beam  Tandem hold on beam   10/25/23 Nustep level 5 x 6 minutes 5# hip extension 5# hip abuction 5# straight arm pulls on airex On airex volley ball Leg press 20# 2x10 Calf stretches Lats 15# Feet on ball K2C, rotation, bridge and isometric abs LE strethces  10/20/23 Nustep level 5 x 6 mintues 15# rows 2x10 15# lats 2x10 5# AR press 2x10 Leg curls 20# 2x10 On airex balance activities ball toss, head turns, eyes closed Feet on ball K2C, rotation, bridge and isometric abs  09/29/23 STS on airex 2x10 NuStep L5x48mins Step ups 6" Calf stretch 30s on slant  Walking on beam Leg ext 10# 2x12 HS curls 25# 2x12 Shoulder ext 5# 2x10  Cable rows 5# 2x10  PATIENT EDUCATION:  Education details: POC and HEP Person educated: Patient Education method: Explanation Education comprehension: verbalized understanding  HOME EXERCISE PROGRAM: Access Code: [email or web address] URL: [email or web address] Date: 08/02/2023 Prepared by: Cassie Freer  Exercises - Supine Lower Trunk Rotation  - 1 x daily - 7 x weekly - 2 sets - 10 reps - Supine Bridge  - 1 x daily - 7 x weekly - 2 sets - 10 reps - Supine Single Knee to Chest Stretch  - 1 x daily - 7 x weekly - 2 reps - 15 hold - Clamshell  - 1 x daily - 7 x weekly - 2 sets - 10 reps - Seated Upper Trapezius Stretch  - 1 x daily - 7 x weekly - 2 reps - 30 hold  ASSESSMENT:  CLINICAL IMPRESSION: LTG 3 and 4 MET . Focus session on performing and issuing HEP for strengthening. Educ on gel injection SE and stressed importance of strengthening to decrease strain on joints and  prevent compensations  OBJECTIVE IMPAIRMENTS: difficulty walking, decreased ROM, decreased strength, impaired flexibility, improper body mechanics, postural dysfunction, and pain.   ACTIVITY LIMITATIONS: standing, squatting, stairs, and locomotion level  REHAB POTENTIAL: Good  CLINICAL DECISION MAKING: Stable/uncomplicated  EVALUATION COMPLEXITY: Low   GOALS: Goals reviewed with patient? Yes  SHORT TERM GOALS: Target date: 09/06/23  Patient will be independent with initial HEP.  Goal status: MET   LONG TERM GOALS:  Target date: 01/10/24  Patient will be independent with advanced/ongoing HEP to improve outcomes and carryover.  Goal status: progressing 10/19/23  progressing 12/20/23    progressing 12/1123  2.  Patient will report 75% improvement in low back pain to improve QOL.  Baseline: 9/10 Goal status: progressing 10/24/23, 50% ongoing 11/29/23  progressing 12/20/23 Progressing 12/27/23  3.  Patient will demonstrate full pain free lumbar ROM to perform ADLs.   Baseline: see chart Goal status: MET 09/27/23  4.  Patient will tolerate 30 min of (standing/sitting/walking) to perform household chores. Baseline: needs rest breaks while cooking and cleaning d/t pain Goal status: progressing 11/03/23, ongoing 11/29/23  MET 12/27/23 states 2 hours with a few short rest breaks  5.  Patient will be able to go up and down stairs without pain Baseline: difficulty with stairs  Goal status: progressing 11/03/23, ongoing, avoids steps if she can 11/29/23 same 12/20/23   ongoing 12/27/23  PLAN:  PT FREQUENCY: 2x/week  PT DURATION: 10 weeks  PLANNED INTERVENTIONS: Therapeutic exercises, Therapeutic activity, Neuromuscular re-education, Balance training, Gait training, Patient/Family education, Self Care, Joint mobilization, Stair training, Dry Needling, Electrical stimulation, Spinal manipulation, Spinal mobilization, Cryotherapy, Moist heat, and Manual therapy.  PLAN FOR NEXT SESSION: progress  with increase strength and func to decrease pain  Crystel Demarco,ANGIE, PTA 12/27/2023, 12:27 PM [facility] at [facility] [address] [facility] [place], [place], [address] Phone: [phone]   Fax:  [phone]  Patient Details  Name: Raegyn Renda MRN: [id number] Date of Birth: 1956-09-07 Referring Provider:  Grayce Sessions, NP  Encounter Date: 12/27/2023   Suanne Marker, PTA 12/27/2023, 12:27 PM  [facility] [place] at [facility] [address] [place], [place], [address] Phone: [phone]   Fax:  [phone] at [facility] [address] [facility] [place] [place], [place], [address] Phone: [phone]   Fax:  [phone]

## 2023-12-28 NOTE — Telephone Encounter (Signed)
[employer] rep triaged call to patient and explain her cost and she wants to put it on hold for the time being.

## 2024-01-03 ENCOUNTER — Ambulatory Visit: Payer: Medicaid Other

## 2024-01-03 DIAGNOSIS — R29898 Other symptoms and signs involving the musculoskeletal system: Secondary | ICD-10-CM

## 2024-01-03 DIAGNOSIS — M6281 Muscle weakness (generalized): Secondary | ICD-10-CM | POA: Diagnosis not present

## 2024-01-03 DIAGNOSIS — G8929 Other chronic pain: Secondary | ICD-10-CM

## 2024-01-03 DIAGNOSIS — M5459 Other low back pain: Secondary | ICD-10-CM

## 2024-01-03 NOTE — Therapy (Signed)
 OUTPATIENT PHYSICAL THERAPY THORACOLUMBAR TREATMENT    Patient Name: Casey Powell MRN: [id number] DOB:1956-06-24, 68 y.o., female Today's Date: 01/03/2024  END OF SESSION:  PT End of Session - 01/03/24 1229     Visit Number 25    Date for PT Re-Evaluation 01/10/24    Authorization Type [place] Medicaid    PT Start Time 1230    PT Stop Time 1315    PT Time Calculation (min) 45 min                          Past Medical History:  Diagnosis Date   Asthma    GERD (gastroesophageal reflux disease)    Hyperlipidemia    Hypertension    Past Surgical History:  Procedure Laterality Date   ABDOMINAL HYSTERECTOMY     TONSILLECTOMY     Patient Active Problem List   Diagnosis Date Noted   DDD (degenerative disc disease), cervical 01/13/2022   DDD (degenerative disc disease), lumbar 01/13/2022   Primary osteoarthritis of both knees 11/30/2021   Chronic right shoulder pain 11/30/2021   DDD (degenerative disc disease), thoracic 11/30/2021    PCP: Gwinda Passe  REFERRING PROVIDER: Rodney Langton  REFERRING DIAG:  M51.36 (ICD-10-CM) - DDD (degenerative disc disease), lumbar    Rationale for Evaluation and Treatment: Rehabilitation  THERAPY DIAG:  Muscle weakness (generalized)  Mid back pain, chronic  Chronic pain of left knee  Chronic pain of right knee  Other low back pain  Other symptoms and signs involving the musculoskeletal system  ONSET DATE: 07/04/23  SUBJECTIVE:                                                                                                                                                                                           SUBJECTIVE STATEMENT: My knees, my back, and shoulder are hurting. Not getting worse but not much change.   Daughter is present at visit to translate  PERTINENT HISTORY:  Known lumbar DDD with a good-sized L3-L4 disc protrusion on MRI  PAIN:  Are you having pain? Yes: NPRS scale:  6/10 Pain location: back, knees Pain description: back feels like someone broke it, neck and down leg is just tight  Aggravating factors: standing, stress, walking far distances  Relieving factors: pain meds, shots in shoulder and knees   PRECAUTIONS: None  RED FLAGS: None   WEIGHT BEARING RESTRICTIONS: No  FALLS:  Has patient fallen in last 6 months? No  LIVING ENVIRONMENT: Lives with: lives with their family Lives in: House/apartment Stairs: Yes: Internal: 15 steps; on right going up and External: 3 steps;  none Has following equipment at home: None  OCCUPATION: Not working  PLOF: Independent  PATIENT GOALS: be able to climb stairs easier, lift up arms better, ease the back pain   NEXT MD VISIT: 08/16/23  OBJECTIVE:   DIAGNOSTIC FINDINGS:  Mild degenerative changes, without spinal canal stenosis or neural foraminal narrowing in the cervical, thoracic, or lumbar spine.  COGNITION: Overall cognitive status: Within functional limits for tasks assessed     SENSATION: WFL  MUSCLE LENGTH: Hamstrings: mild tightness bilaterally   POSTURE: rounded shoulders, forward head, and increased thoracic kyphosis  PALPATION: TTP L4-L5 and SIJ   LUMBAR ROM:   AROM eval 09/27/23  Flexion Able to touch toes   Extension 50%  WFL  Right lateral flexion Mid thigh with pain WFL  Left lateral flexion Mid thigh with pain WFL  Right rotation WFL   Left rotation WFL    (Blank rows = not tested)  LOWER EXTREMITY ROM:  WNL   LOWER EXTREMITY MMT:    MMT Right eval Left eval  Hip flexion 5 5  Hip extension    Hip abduction 4 4  Hip adduction 5 5  Hip internal rotation    Hip external rotation    Knee flexion 5 5  Knee extension 4 4  Ankle dorsiflexion    Ankle plantarflexion    Ankle inversion    Ankle eversion     (Blank rows = not tested)  LUMBAR SPECIAL TESTS:  Straight leg raise test: Positive and FABER test: Positive  FUNCTIONAL TESTS:  5 times sit to stand:  12.58s Timed up and go (TUG): 12.35s   TODAY'S TREATMENT:                                                                                                                              DATE:  01/03/24 NuStep L5x35mins Leg ext 10# 2x10 HS curl 20# 2x10 STS on airex 2x10 Rows and extension with green band 2x10 MH to L shoulder x10 mins  12/27/23 Nustep L 4 6 min Black tband trunk flex and ext 20 x Seated row and lat pull down 2 sets 10  20# Leg press 20# 3x10 Calf raises 2x12 Standing red tband hip 3 way BIL 10 each-issued as HEP Green tband shld ext,row and ER- issued as HEP STS, LAQ and hip flex -issued as HEP Educ on gel inj shots   12/20/23 Nustep L 4 6 min Seated green tband trunk flex and ext 10 each plus trunk rotation 10 x each way Seated green tband hip adb 15 x Seated green tband hip flexion 2 sets 10 STS on airex 2 sets 5 4 inch step up fwd and laterally 10 x each side with UE support Resisted gait 20# 4 way x4 - increased discomfort laterally Leg press 20# 2x10 Calf raises 2x12 Standing red tband row and shld ext 12 x each  11/28/13 Recert, recheck goals  Leg press 20# 2x10 Calf  raises 2x12 NuStep L5x91mins  5xSTS 16.46s  Resisted gait 20# 4 way x4  Step ups 4"   11/24/23 NuStep L5x21mins Step ups 4" Lateral step ups 4"  Leg ext 10# 2x12 HS curls 20# 2x12  Feet on ball K2C, rotation, bridge and isometric abs Stretching for HS, knees to chest  11/22/23 Nustep level 5 x 7 minutes Tried the bike but his was too much pain with the bend on the right knee 15# Leg curls 2x12 10# rows 2x12 5# straight arm pulls cues to engage core 5# hip extension Feet on ball K2C, rotation, bridge and isometric abs Passive stretch LE's STM to the low back into the upper traps  11/03/23 Nustep level 5 x 7 minutes 20# leg curls 2x10 5# hip extension and abduction 5# straight arm pulls 10# AR press Feet on ball K2C, rotation, bridge, isometric abs Passive LE stretches STM  with the T-gun to the hips posterior and laterally  11/01/23 Leg curls 20# 2x10 Standing 5# straight arm pulls Nustep level 4 x 6 minutes 5# hip extension, abduction and SLR On airex ball toss, head turns On airex cone toe touch Feet on ball K2C, rotation, small bridge Passive LE stretches STM with tht eT-gun to the hips and low back due to the increased pain  10/27/23 Calf raises 2x12  Resisted gait 20# 4 way x4 NuStep L5 x17mins  Leg press 20# 2x10 Seated rows and lat pulls 20# 2x10  LE stretching  Walking on beam  Tandem hold on beam   10/25/23 Nustep level 5 x 6 minutes 5# hip extension 5# hip abuction 5# straight arm pulls on airex On airex volley ball Leg press 20# 2x10 Calf stretches Lats 15# Feet on ball K2C, rotation, bridge and isometric abs LE strethces  10/20/23 Nustep level 5 x 6 mintues 15# rows 2x10 15# lats 2x10 5# AR press 2x10 Leg curls 20# 2x10 On airex balance activities ball toss, head turns, eyes closed Feet on ball K2C, rotation, bridge and isometric abs  09/29/23 STS on airex 2x10 NuStep L5x99mins Step ups 6" Calf stretch 30s on slant  Walking on beam Leg ext 10# 2x12 HS curls 25# 2x12 Shoulder ext 5# 2x10  Cable rows 5# 2x10  PATIENT EDUCATION:  Education details: POC and HEP Person educated: Patient Education method: Explanation Education comprehension: verbalized understanding  HOME EXERCISE PROGRAM: Access Code: [email or web address] URL: [email or web address] Date: 08/02/2023 Prepared by: Cassie Freer  Exercises - Supine Lower Trunk Rotation  - 1 x daily - 7 x weekly - 2 sets - 10 reps - Supine Bridge  - 1 x daily - 7 x weekly - 2 sets - 10 reps - Supine Single Knee to Chest Stretch  - 1 x daily - 7 x weekly - 2 reps - 15 hold - Clamshell  - 1 x daily - 7 x weekly - 2 sets - 10 reps - Seated Upper Trapezius Stretch  - 1 x daily - 7 x weekly - 2 reps - 30 hold  ASSESSMENT:  CLINICAL IMPRESSION: Patient continues to  have pain in multiple joints, with no real changes thus far. She say pain management and was told she has OA and there is no immediate cure for it. States she could feel it in her knee with the last few reps of sit to stands. Also felt some pain in her L shoulder after second set of rows. Elected to do heat at end of session as she states cold is not good  for her.   OBJECTIVE IMPAIRMENTS: difficulty walking, decreased ROM, decreased strength, impaired flexibility, improper body mechanics, postural dysfunction, and pain.   ACTIVITY LIMITATIONS: standing, squatting, stairs, and locomotion level  REHAB POTENTIAL: Good  CLINICAL DECISION MAKING: Stable/uncomplicated  EVALUATION COMPLEXITY: Low   GOALS: Goals reviewed with patient? Yes  SHORT TERM GOALS: Target date: 09/06/23  Patient will be independent with initial HEP.  Goal status: MET   LONG TERM GOALS: Target date: 01/10/24  Patient will be independent with advanced/ongoing HEP to improve outcomes and carryover.  Goal status: progressing 10/19/23  progressing 12/20/23    progressing 12/1123  2.  Patient will report 75% improvement in low back pain to improve QOL.  Baseline: 9/10 Goal status: progressing 10/24/23, 50% ongoing 11/29/23  progressing 12/20/23 Progressing 12/27/23  3.  Patient will demonstrate full pain free lumbar ROM to perform ADLs.   Baseline: see chart Goal status: MET 09/27/23  4.  Patient will tolerate 30 min of (standing/sitting/walking) to perform household chores. Baseline: needs rest breaks while cooking and cleaning d/t pain Goal status: progressing 11/03/23, ongoing 11/29/23  MET 12/27/23 states 2 hours with a few short rest breaks  5.  Patient will be able to go up and down stairs without pain Baseline: difficulty with stairs  Goal status: progressing 11/03/23, ongoing, avoids steps if she can 11/29/23 same 12/20/23   ongoing 12/27/23  PLAN:  PT FREQUENCY: 2x/week  PT DURATION: 10 weeks  PLANNED  INTERVENTIONS: Therapeutic exercises, Therapeutic activity, Neuromuscular re-education, Balance training, Gait training, Patient/Family education, Self Care, Joint mobilization, Stair training, Dry Needling, Electrical stimulation, Spinal manipulation, Spinal mobilization, Cryotherapy, Moist heat, and Manual therapy.  PLAN FOR NEXT SESSION: progress with increase strength and func to decrease pain  Cassie Freer, PT 01/03/2024, 1:12 PM [place] [facility] Out[facility] Rehabilitation at [facility] [address] [facility] [place], [place], [address] Phone: [phone]   Fax:  [phone]  Patient Details  Name: Casey [place] MRN: [id number] Date of Birth: 04/18/1956 Referring Provider:  Grayce Sessions, NP  Encounter Date: 01/03/2024   Cassie Freer, PT 01/03/2024, 1:12 PM  [place] [facility] at [facility] [address] [facility] [place], [place], [address] Phone: [phone]   Fax:  [phone] Out[facility] Rehabilitation at [facility] [address]. [place], [place], [address] Phone: [phone]   Fax:  [phone]

## 2024-01-05 ENCOUNTER — Ambulatory Visit: Payer: Medicaid Other

## 2024-01-05 DIAGNOSIS — R29898 Other symptoms and signs involving the musculoskeletal system: Secondary | ICD-10-CM

## 2024-01-05 DIAGNOSIS — G8929 Other chronic pain: Secondary | ICD-10-CM

## 2024-01-05 DIAGNOSIS — M549 Dorsalgia, unspecified: Secondary | ICD-10-CM

## 2024-01-05 DIAGNOSIS — M6281 Muscle weakness (generalized): Secondary | ICD-10-CM | POA: Diagnosis not present

## 2024-01-05 DIAGNOSIS — M5459 Other low back pain: Secondary | ICD-10-CM

## 2024-01-05 NOTE — Therapy (Signed)
 OUTPATIENT PHYSICAL THERAPY THORACOLUMBAR TREATMENT    Patient Name: Casey Powell MRN: [id number] DOB:1956-10-26, 68 y.o., female Today's Date: 01/05/2024  END OF SESSION:  PT End of Session - 01/05/24 1315     Visit Number 26    Date for PT Re-Evaluation 02/21/24    Authorization Type [place] Medicaid    Authorization - Visit Number 7    Authorization - Number of Visits 12    PT Start Time 1315    PT Stop Time 1355    PT Time Calculation (min) 40 min                           Past Medical History:  Diagnosis Date   Asthma    GERD (gastroesophageal reflux disease)    Hyperlipidemia    Hypertension    Past Surgical History:  Procedure Laterality Date   ABDOMINAL HYSTERECTOMY     TONSILLECTOMY     Patient Active Problem List   Diagnosis Date Noted   DDD (degenerative disc disease), cervical 01/13/2022   DDD (degenerative disc disease), lumbar 01/13/2022   Primary osteoarthritis of both knees 11/30/2021   Chronic right shoulder pain 11/30/2021   DDD (degenerative disc disease), thoracic 11/30/2021    PCP: Gwinda Passe  REFERRING PROVIDER: Rodney Langton  REFERRING DIAG:  M51.36 (ICD-10-CM) - DDD (degenerative disc disease), lumbar    Rationale for Evaluation and Treatment: Rehabilitation  THERAPY DIAG:  Muscle weakness (generalized)  Mid back pain, chronic  Chronic pain of left knee  Chronic pain of right knee  Other low back pain  Other symptoms and signs involving the musculoskeletal system  ONSET DATE: 07/04/23  SUBJECTIVE:                                                                                                                                                                                           SUBJECTIVE STATEMENT: Knee is hurting. Back and shoulder are a little better.   Daughter is present at visit to translate  PERTINENT HISTORY:  Known lumbar DDD with a good-sized L3-L4 disc protrusion on  MRI  PAIN:  Are you having pain? Yes: NPRS scale: 6/10 Pain location: back, knees Pain description: back feels like someone broke it, neck and down leg is just tight  Aggravating factors: standing, stress, walking far distances  Relieving factors: pain meds, shots in shoulder and knees   PRECAUTIONS: None  RED FLAGS: None   WEIGHT BEARING RESTRICTIONS: No  FALLS:  Has patient fallen in last 6 months? No  LIVING ENVIRONMENT: Lives with: lives with their family Lives in: House/apartment  Stairs: Yes: Internal: 15 steps; on right going up and External: 3 steps; none Has following equipment at home: None  OCCUPATION: Not working  PLOF: Independent  PATIENT GOALS: be able to climb stairs easier, lift up arms better, ease the back pain   NEXT MD VISIT: 08/16/23  OBJECTIVE:   DIAGNOSTIC FINDINGS:  Mild degenerative changes, without spinal canal stenosis or neural foraminal narrowing in the cervical, thoracic, or lumbar spine.  COGNITION: Overall cognitive status: Within functional limits for tasks assessed     SENSATION: WFL  MUSCLE LENGTH: Hamstrings: mild tightness bilaterally   POSTURE: rounded shoulders, forward head, and increased thoracic kyphosis  PALPATION: TTP L4-L5 and SIJ   LUMBAR ROM:   AROM eval 09/27/23  Flexion Able to touch toes   Extension 50%  WFL  Right lateral flexion Mid thigh with pain WFL  Left lateral flexion Mid thigh with pain WFL  Right rotation WFL   Left rotation WFL    (Blank rows = not tested)  LOWER EXTREMITY ROM:  WNL   LOWER EXTREMITY MMT:    MMT Right eval Left eval  Hip flexion 5 5  Hip extension    Hip abduction 4 4  Hip adduction 5 5  Hip internal rotation    Hip external rotation    Knee flexion 5 5  Knee extension 4 4  Ankle dorsiflexion    Ankle plantarflexion    Ankle inversion    Ankle eversion     (Blank rows = not tested)  LUMBAR SPECIAL TESTS:  Straight leg raise test: Positive and FABER test:  Positive  FUNCTIONAL TESTS:  5 times sit to stand: 12.58s Timed up and go (TUG): 12.35s   TODAY'S TREATMENT:                                                                                                                              DATE:  01/05/24 NuStep L5x47mins Step ups 4"  Lateral step ups 4" Rows and Lat pull downs 20# 2x10 blackTB ext 2x10  Resisted gait 10# 4 way x4  STS with yellow ball chest press 2x10   01/03/24 NuStep L5x68mins Leg ext 10# 2x10 HS curl 20# 2x10 STS on airex 2x10 Rows and extension with green band 2x10 MH to L shoulder x10 mins  12/27/23 Nustep L 4 6 min Black tband trunk flex and ext 20 x Seated row and lat pull down 2 sets 10  20# Leg press 20# 3x10 Calf raises 2x12 Standing red tband hip 3 way BIL 10 each-issued as HEP Green tband shld ext,row and ER- issued as HEP STS, LAQ and hip flex -issued as HEP Educ on gel inj shots   12/20/23 Nustep L 4 6 min Seated green tband trunk flex and ext 10 each plus trunk rotation 10 x each way Seated green tband hip adb 15 x Seated green tband hip flexion 2 sets 10 STS on airex 2 sets 5 4  inch step up fwd and laterally 10 x each side with UE support Resisted gait 20# 4 way x4 - increased discomfort laterally Leg press 20# 2x10 Calf raises 2x12 Standing red tband row and shld ext 12 x each  11/28/13 Recert, recheck goals  Leg press 20# 2x10 Calf raises 2x12 NuStep L5x85mins  5xSTS 16.46s  Resisted gait 20# 4 way x4  Step ups 4"   11/24/23 NuStep L5x77mins Step ups 4" Lateral step ups 4"  Leg ext 10# 2x12 HS curls 20# 2x12  Feet on ball K2C, rotation, bridge and isometric abs Stretching for HS, knees to chest  11/22/23 Nustep level 5 x 7 minutes Tried the bike but his was too much pain with the bend on the right knee 15# Leg curls 2x12 10# rows 2x12 5# straight arm pulls cues to engage core 5# hip extension Feet on ball K2C, rotation, bridge and isometric abs Passive stretch LE's STM to  the low back into the upper traps  11/03/23 Nustep level 5 x 7 minutes 20# leg curls 2x10 5# hip extension and abduction 5# straight arm pulls 10# AR press Feet on ball K2C, rotation, bridge, isometric abs Passive LE stretches STM with the T-gun to the hips posterior and laterally  11/01/23 Leg curls 20# 2x10 Standing 5# straight arm pulls Nustep level 4 x 6 minutes 5# hip extension, abduction and SLR On airex ball toss, head turns On airex cone toe touch Feet on ball K2C, rotation, small bridge Passive LE stretches STM with tht eT-gun to the hips and low back due to the increased pain  10/27/23 Calf raises 2x12  Resisted gait 20# 4 way x4 NuStep L5 x40mins  Leg press 20# 2x10 Seated rows and lat pulls 20# 2x10  LE stretching  Walking on beam  Tandem hold on beam   10/25/23 Nustep level 5 x 6 minutes 5# hip extension 5# hip abuction 5# straight arm pulls on airex On airex volley ball Leg press 20# 2x10 Calf stretches Lats 15# Feet on ball K2C, rotation, bridge and isometric abs LE strethces  10/20/23 Nustep level 5 x 6 mintues 15# rows 2x10 15# lats 2x10 5# AR press 2x10 Leg curls 20# 2x10 On airex balance activities ball toss, head turns, eyes closed Feet on ball K2C, rotation, bridge and isometric abs  09/29/23 STS on airex 2x10 NuStep L5x70mins Step ups 6" Calf stretch 30s on slant  Walking on beam Leg ext 10# 2x12 HS curls 25# 2x12 Shoulder ext 5# 2x10  Cable rows 5# 2x10  PATIENT EDUCATION:  Education details: POC and HEP Person educated: Patient Education method: Explanation Education comprehension: verbalized understanding  HOME EXERCISE PROGRAM: Access Code: [email or web address] URL: [email or web address] Date: 08/02/2023 Prepared by: Cassie Freer  Exercises - Supine Lower Trunk Rotation  - 1 x daily - 7 x weekly - 2 sets - 10 reps - Supine Bridge  - 1 x daily - 7 x weekly - 2 sets - 10 reps - Supine Single Knee to Chest Stretch   - 1 x daily - 7 x weekly - 2 reps - 15 hold - Clamshell  - 1 x daily - 7 x weekly - 2 sets - 10 reps - Seated Upper Trapezius Stretch  - 1 x daily - 7 x weekly - 2 reps - 30 hold  ASSESSMENT:  CLINICAL IMPRESSION: Patient continues to have knee pain. The cold weather is also making it worse. Had some difficulty and some pain with last few reps of lateral  step ups on 4" step. Daughter reports she is not eligible for gel shots because of insurance. They plan on talking to the doctor to see if they can sign up for a program or assistance to get them. Does well with interventions done today otherwise.   OBJECTIVE IMPAIRMENTS: difficulty walking, decreased ROM, decreased strength, impaired flexibility, improper body mechanics, postural dysfunction, and pain.   ACTIVITY LIMITATIONS: standing, squatting, stairs, and locomotion level  REHAB POTENTIAL: Good  CLINICAL DECISION MAKING: Stable/uncomplicated  EVALUATION COMPLEXITY: Low   GOALS: Goals reviewed with patient? Yes  SHORT TERM GOALS: Target date: 09/06/23  Patient will be independent with initial HEP.  Goal status: MET   LONG TERM GOALS: Target date: 02/21/24  Patient will be independent with advanced/ongoing HEP to improve outcomes and carryover.  Goal status: progressing 10/19/23  progressing 12/20/23    progressing 12/1123  2.  Patient will report 75% improvement in low back pain to improve QOL.  Baseline: 9/10 Goal status: progressing 10/24/23, 50% ongoing 11/29/23  progressing 12/20/23 Progressing 12/27/23  3.  Patient will demonstrate full pain free lumbar ROM to perform ADLs.   Baseline: see chart Goal status: MET 09/27/23  4.  Patient will tolerate 30 min of (standing/sitting/walking) to perform household chores. Baseline: needs rest breaks while cooking and cleaning d/t pain Goal status: progressing 11/03/23, ongoing 11/29/23  MET 12/27/23 states 2 hours with a few short rest breaks  5.  Patient will be able to go up and  down stairs without pain Baseline: difficulty with stairs  Goal status: progressing 11/03/23, ongoing, avoids steps if she can 11/29/23 same 12/20/23   ongoing 12/27/23  PLAN:  PT FREQUENCY: 2x/week  PT DURATION: 10 weeks  PLANNED INTERVENTIONS: Therapeutic exercises, Therapeutic activity, Neuromuscular re-education, Balance training, Gait training, Patient/Family education, Self Care, Joint mobilization, Stair training, Dry Needling, Electrical stimulation, Spinal manipulation, Spinal mobilization, Cryotherapy, Moist heat, and Manual therapy.  PLAN FOR NEXT SESSION: knee strengthening as tolerated without pain   Cassie Freer, PT 01/05/2024, 1:51 PM [facility] at [facility] [address] [facility]. [place], [place], [address] Phone: [phone]   Fax:  [phone]  Patient Details  Name: Casey Powell MRN: [id number] Date of Birth: 09-09-1956 Referring Provider:  Grayce Sessions, NP  Encounter Date: 01/05/2024   Cassie Freer, PT 01/05/2024, 1:51 PM  [facility] at [facility] [address] [facility]. [place], [place], [address] Phone: [phone]   Fax:  [phone] Out[facility] at [facility] [address] [place], [place], [address] Phone: [phone]   Fax:  [phone]

## 2024-01-10 ENCOUNTER — Ambulatory Visit: Payer: Medicaid Other | Admitting: Physical Therapy

## 2024-01-11 NOTE — Therapy (Signed)
 OUTPATIENT PHYSICAL THERAPY THORACOLUMBAR TREATMENT    Patient Name: Casey Powell MRN: [id number] DOB:October 27, 1956, 68 y.o., female Today's Date: 01/12/2024  END OF SESSION:  PT End of Session - 01/12/24 1230     Visit Number 27    Date for PT Re-Evaluation 02/21/24    Authorization Type [place] Medicaid    Authorization - Visit Number 8    Authorization - Number of Visits 12    PT Start Time 1230    PT Stop Time 1315    PT Time Calculation (min) 45 min                            Past Medical History:  Diagnosis Date   Asthma    GERD (gastroesophageal reflux disease)    Hyperlipidemia    Hypertension    Past Surgical History:  Procedure Laterality Date   ABDOMINAL HYSTERECTOMY     TONSILLECTOMY     Patient Active Problem List   Diagnosis Date Noted   DDD (degenerative disc disease), cervical 01/13/2022   DDD (degenerative disc disease), lumbar 01/13/2022   Primary osteoarthritis of both knees 11/30/2021   Chronic right shoulder pain 11/30/2021   DDD (degenerative disc disease), thoracic 11/30/2021    PCP: Gwinda Passe  REFERRING PROVIDER: Rodney Langton  REFERRING DIAG:  M51.36 (ICD-10-CM) - DDD (degenerative disc disease), lumbar    Rationale for Evaluation and Treatment: Rehabilitation  THERAPY DIAG:  Muscle weakness (generalized)  Mid back pain, chronic  Chronic pain of left knee  Chronic pain of right knee  Other low back pain  Other symptoms and signs involving the musculoskeletal system  ONSET DATE: 07/04/23  SUBJECTIVE:                                                                                                                                                                                           SUBJECTIVE STATEMENT: Knee and shoulder still hurting   Daughter is present at visit to translate  PERTINENT HISTORY:  Known lumbar DDD with a good-sized L3-L4 disc protrusion on MRI  PAIN:  Are you having  pain? Yes: NPRS scale: 6/10 Pain location: back, knees Pain description: back feels like someone broke it, neck and down leg is just tight  Aggravating factors: standing, stress, walking far distances  Relieving factors: pain meds, shots in shoulder and knees   PRECAUTIONS: None  RED FLAGS: None   WEIGHT BEARING RESTRICTIONS: No  FALLS:  Has patient fallen in last 6 months? No  LIVING ENVIRONMENT: Lives with: lives with their family Lives in: House/apartment Stairs: Yes: Internal: 15  steps; on right going up and External: 3 steps; none Has following equipment at home: None  OCCUPATION: Not working  PLOF: Independent  PATIENT GOALS: be able to climb stairs easier, lift up arms better, ease the back pain   NEXT MD VISIT: 08/16/23  OBJECTIVE:   DIAGNOSTIC FINDINGS:  Mild degenerative changes, without spinal canal stenosis or neural foraminal narrowing in the cervical, thoracic, or lumbar spine.  COGNITION: Overall cognitive status: Within functional limits for tasks assessed     SENSATION: WFL  MUSCLE LENGTH: Hamstrings: mild tightness bilaterally   POSTURE: rounded shoulders, forward head, and increased thoracic kyphosis  PALPATION: TTP L4-L5 and SIJ   LUMBAR ROM:   AROM eval 09/27/23  Flexion Able to touch toes   Extension 50%  WFL  Right lateral flexion Mid thigh with pain WFL  Left lateral flexion Mid thigh with pain WFL  Right rotation WFL   Left rotation WFL    (Blank rows = not tested)  LOWER EXTREMITY ROM:  WNL   LOWER EXTREMITY MMT:    MMT Right eval Left eval  Hip flexion 5 5  Hip extension    Hip abduction 4 4  Hip adduction 5 5  Hip internal rotation    Hip external rotation    Knee flexion 5 5  Knee extension 4 4  Ankle dorsiflexion    Ankle plantarflexion    Ankle inversion    Ankle eversion     (Blank rows = not tested)  LUMBAR SPECIAL TESTS:  Straight leg raise test: Positive and FABER test: Positive  FUNCTIONAL TESTS:   5 times sit to stand: 12.58s Timed up and go (TUG): 12.35s   TODAY'S TREATMENT:                                                                                                                              DATE:  01/12/24 NuStep L5 x52mins Modified sit ups with yellow ball 2x10 Seated rotations with weighted ball 2x10 alt  Chest press to OHP with weighted ball 2x10 Leg press 20# 2x10 SLR 2x10 Bridges 2x10 Passive LE stretches   01/05/24 NuStep L5x37mins Step ups 4"  Lateral step ups 4" Rows and Lat pull downs 20# 2x10 blackTB ext 2x10  Resisted gait 10# 4 way x4  STS with yellow ball chest press 2x10   01/03/24 NuStep L5x56mins Leg ext 10# 2x10 HS curl 20# 2x10 STS on airex 2x10 Rows and extension with green band 2x10 MH to L shoulder x10 mins  12/27/23 Nustep L 4 6 min Black tband trunk flex and ext 20 x Seated row and lat pull down 2 sets 10  20# Leg press 20# 3x10 Calf raises 2x12 Standing red tband hip 3 way BIL 10 each-issued as HEP Green tband shld ext,row and ER- issued as HEP STS, LAQ and hip flex -issued as HEP Educ on gel inj shots   12/20/23 Nustep L 4 6 min Seated green  tband trunk flex and ext 10 each plus trunk rotation 10 x each way Seated green tband hip adb 15 x Seated green tband hip flexion 2 sets 10 STS on airex 2 sets 5 4 inch step up fwd and laterally 10 x each side with UE support Resisted gait 20# 4 way x4 - increased discomfort laterally Leg press 20# 2x10 Calf raises 2x12 Standing red tband row and shld ext 12 x each  11/28/13 Recert, recheck goals  Leg press 20# 2x10 Calf raises 2x12 NuStep L5x46mins  5xSTS 16.46s  Resisted gait 20# 4 way x4  Step ups 4"   11/24/23 NuStep L5x108mins Step ups 4" Lateral step ups 4"  Leg ext 10# 2x12 HS curls 20# 2x12  Feet on ball K2C, rotation, bridge and isometric abs Stretching for HS, knees to chest  11/22/23 Nustep level 5 x 7 minutes Tried the bike but his was too much pain with the bend on  the right knee 15# Leg curls 2x12 10# rows 2x12 5# straight arm pulls cues to engage core 5# hip extension Feet on ball K2C, rotation, bridge and isometric abs Passive stretch LE's STM to the low back into the upper traps  11/03/23 Nustep level 5 x 7 minutes 20# leg curls 2x10 5# hip extension and abduction 5# straight arm pulls 10# AR press Feet on ball K2C, rotation, bridge, isometric abs Passive LE stretches STM with the T-gun to the hips posterior and laterally  11/01/23 Leg curls 20# 2x10 Standing 5# straight arm pulls Nustep level 4 x 6 minutes 5# hip extension, abduction and SLR On airex ball toss, head turns On airex cone toe touch Feet on ball K2C, rotation, small bridge Passive LE stretches STM with tht eT-gun to the hips and low back due to the increased pain  10/27/23 Calf raises 2x12  Resisted gait 20# 4 way x4 NuStep L5 x74mins  Leg press 20# 2x10 Seated rows and lat pulls 20# 2x10  LE stretching  Walking on beam  Tandem hold on beam   10/25/23 Nustep level 5 x 6 minutes 5# hip extension 5# hip abuction 5# straight arm pulls on airex On airex volley ball Leg press 20# 2x10 Calf stretches Lats 15# Feet on ball K2C, rotation, bridge and isometric abs LE strethces  10/20/23 Nustep level 5 x 6 mintues 15# rows 2x10 15# lats 2x10 5# AR press 2x10 Leg curls 20# 2x10 On airex balance activities ball toss, head turns, eyes closed Feet on ball K2C, rotation, bridge and isometric abs  09/29/23 STS on airex 2x10 NuStep L5x68mins Step ups 6" Calf stretch 30s on slant  Walking on beam Leg ext 10# 2x12 HS curls 25# 2x12 Shoulder ext 5# 2x10  Cable rows 5# 2x10  PATIENT EDUCATION:  Education details: POC and HEP Person educated: Patient Education method: Explanation Education comprehension: verbalized understanding  HOME EXERCISE PROGRAM: Access Code: [email or web address] URL: [email or web address] Date: 08/02/2023 Prepared by: Cassie Freer  Exercises - Supine Lower Trunk Rotation  - 1 x daily - 7 x weekly - 2 sets - 10 reps - Supine Bridge  - 1 x daily - 7 x weekly - 2 sets - 10 reps - Supine Single Knee to Chest Stretch  - 1 x daily - 7 x weekly - 2 reps - 15 hold - Clamshell  - 1 x daily - 7 x weekly - 2 sets - 10 reps - Seated Upper Trapezius Stretch  - 1 x daily - 7 x weekly -  2 reps - 30 hold  ASSESSMENT:  CLINICAL IMPRESSION: Patient continues to have knee pain. Daughter reports she is not eligible for gel shots because of insurance. They plan on talking to the doctor to see if they can sign up for a program or assistance to get them. Does well with interventions done today otherwise. Has 4 visits left in this authorization period until 02/21/24, so will decide about scheduling.   OBJECTIVE IMPAIRMENTS: difficulty walking, decreased ROM, decreased strength, impaired flexibility, improper body mechanics, postural dysfunction, and pain.   ACTIVITY LIMITATIONS: standing, squatting, stairs, and locomotion level  REHAB POTENTIAL: Good  CLINICAL DECISION MAKING: Stable/uncomplicated  EVALUATION COMPLEXITY: Low   GOALS: Goals reviewed with patient? Yes  SHORT TERM GOALS: Target date: 09/06/23  Patient will be independent with initial HEP.  Goal status: MET   LONG TERM GOALS: Target date: 02/21/24  Patient will be independent with advanced/ongoing HEP to improve outcomes and carryover.  Goal status: progressing 10/19/23  progressing 12/20/23    progressing 12/1123  2.  Patient will report 75% improvement in low back and knee pain to improve QOL.  Baseline: 9/10 Goal status: progressing 10/24/23, 50% ongoing 11/29/23  progressing 12/20/23 Progressing 12/27/23  3.  Patient will demonstrate full pain free lumbar ROM to perform ADLs.   Baseline: see chart Goal status: MET 09/27/23  4.  Patient will tolerate 30 min of (standing/sitting/walking) to perform household chores. Baseline: needs rest breaks while cooking  and cleaning d/t pain Goal status: progressing 11/03/23, ongoing 11/29/23  MET 12/27/23 states 2 hours with a few short rest breaks  5.  Patient will be able to go up and down stairs without pain Baseline: difficulty with stairs  Goal status: progressing 11/03/23, ongoing, avoids steps if she can 11/29/23 same 12/20/23   ongoing 12/27/23  PLAN:  PT FREQUENCY: 2x/week  PT DURATION: 10 weeks  PLANNED INTERVENTIONS: Therapeutic exercises, Therapeutic activity, Neuromuscular re-education, Balance training, Gait training, Patient/Family education, Self Care, Joint mobilization, Stair training, Dry Needling, Electrical stimulation, Spinal manipulation, Spinal mobilization, Cryotherapy, Moist heat, and Manual therapy.  PLAN FOR NEXT SESSION: knee strengthening as tolerated without pain   Cassie Freer, PT 01/12/2024, 1:18 PM [facility] Outpatient Rehabilitation at [facility] [address] [facility] [place], [place], [address] Phone: [phone]   Fax:  [phone]  Patient Details  Name: Casey Powell MRN: [id number] Date of Birth: [birth year] Referring Provider:  Grayce Sessions, NP  Encounter Date: 01/12/2024   Cassie Freer, PT 01/12/2024, 1:18 PM  [place] [facility] at [facility] [address] [facility]. [place], [place], [address] Phone: [phone]   Fax:  [phone] Outpatient Rehabilitation at [facility] [address] [facility] [address]. [place], [place], [address] Phone: [phone]   Fax:  [phone]

## 2024-01-12 ENCOUNTER — Ambulatory Visit: Payer: Medicaid Other

## 2024-01-12 DIAGNOSIS — R29898 Other symptoms and signs involving the musculoskeletal system: Secondary | ICD-10-CM

## 2024-01-12 DIAGNOSIS — M6281 Muscle weakness (generalized): Secondary | ICD-10-CM

## 2024-01-12 DIAGNOSIS — M549 Dorsalgia, unspecified: Secondary | ICD-10-CM

## 2024-01-12 DIAGNOSIS — M5459 Other low back pain: Secondary | ICD-10-CM

## 2024-01-12 DIAGNOSIS — G8929 Other chronic pain: Secondary | ICD-10-CM

## 2024-02-02 ENCOUNTER — Ambulatory Visit: Payer: Medicaid Other

## 2024-02-09 ENCOUNTER — Ambulatory Visit: Payer: Medicaid Other | Admitting: Physical Therapy

## 2024-02-16 ENCOUNTER — Ambulatory Visit: Payer: Medicaid Other

## 2024-02-21 ENCOUNTER — Ambulatory Visit: Payer: Medicaid Other

## 2024-06-06 ENCOUNTER — Other Ambulatory Visit: Payer: Medicaid Other

## 2024-06-06 ENCOUNTER — Ambulatory Visit (HOSPITAL_BASED_OUTPATIENT_CLINIC_OR_DEPARTMENT_OTHER)
Admission: RE | Admit: 2024-06-06 | Discharge: 2024-06-06 | Disposition: A | Discharge status: Home / Self Care | Source: Ambulatory Visit | Attending: Physician Assistant | Admitting: Physician Assistant

## 2024-06-06 DIAGNOSIS — R5381 Other malaise: Secondary | ICD-10-CM | POA: Diagnosis present

## 2024-06-06 DIAGNOSIS — Z13820 Encounter for screening for osteoporosis: Secondary | ICD-10-CM | POA: Diagnosis present

## 2024-07-19 ENCOUNTER — Encounter: Payer: Self-pay | Admitting: Sports Medicine

## 2024-08-09 ENCOUNTER — Other Ambulatory Visit: Payer: Self-pay

## 2024-08-09 DIAGNOSIS — H34212 Partial retinal artery occlusion, left eye: Secondary | ICD-10-CM

## 2024-08-10 ENCOUNTER — Other Ambulatory Visit: Payer: Self-pay

## 2024-08-10 DIAGNOSIS — R1031 Right lower quadrant pain: Secondary | ICD-10-CM

## 2024-08-13 ENCOUNTER — Other Ambulatory Visit (HOSPITAL_COMMUNITY): Payer: Self-pay

## 2024-08-13 DIAGNOSIS — H34212 Partial retinal artery occlusion, left eye: Secondary | ICD-10-CM

## 2024-08-14 ENCOUNTER — Ambulatory Visit: Admission: RE | Admit: 2024-08-14 | Discharge: 2024-08-14 | Disposition: A | Source: Ambulatory Visit

## 2024-08-14 ENCOUNTER — Ambulatory Visit
Admission: RE | Admit: 2024-08-14 | Discharge: 2024-08-14 | Disposition: A | Discharge status: Home / Self Care | Source: Ambulatory Visit

## 2024-08-14 DIAGNOSIS — R1032 Left lower quadrant pain: Secondary | ICD-10-CM

## 2024-08-14 DIAGNOSIS — H34212 Partial retinal artery occlusion, left eye: Secondary | ICD-10-CM

## 2024-09-06 ENCOUNTER — Ambulatory Visit (HOSPITAL_COMMUNITY)

## 2024-09-06 ENCOUNTER — Encounter (HOSPITAL_COMMUNITY): Payer: Self-pay

## 2024-09-17 ENCOUNTER — Other Ambulatory Visit (HOSPITAL_COMMUNITY)

## 2024-10-10 ENCOUNTER — Other Ambulatory Visit: Payer: Self-pay | Admitting: Family

## 2024-10-10 DIAGNOSIS — K469 Unspecified abdominal hernia without obstruction or gangrene: Secondary | ICD-10-CM

## 2024-10-29 ENCOUNTER — Ambulatory Visit (HOSPITAL_COMMUNITY): Admission: RE | Admit: 2024-10-29 | Discharge: 2024-10-29 | Disposition: A | Source: Ambulatory Visit

## 2024-10-29 ENCOUNTER — Ambulatory Visit
Admission: RE | Admit: 2024-10-29 | Discharge: 2024-10-29 | Disposition: A | Source: Ambulatory Visit | Attending: Family

## 2024-10-29 DIAGNOSIS — H34212 Partial retinal artery occlusion, left eye: Secondary | ICD-10-CM | POA: Diagnosis present

## 2024-10-29 DIAGNOSIS — K469 Unspecified abdominal hernia without obstruction or gangrene: Secondary | ICD-10-CM

## 2024-10-29 LAB — ECHOCARDIOGRAM COMPLETE
Area-P 1/2: 3.54 cm2
S' Lateral: 2.44 cm

## 2024-12-11 ENCOUNTER — Other Ambulatory Visit: Payer: Self-pay

## 2024-12-11 DIAGNOSIS — I872 Venous insufficiency (chronic) (peripheral): Secondary | ICD-10-CM

## 2025-01-03 ENCOUNTER — Encounter: Admitting: Vascular Surgery

## 2025-01-03 ENCOUNTER — Ambulatory Visit (HOSPITAL_COMMUNITY)
